# Patient Record
Sex: Female | Born: 1995 | Race: Black or African American | Hispanic: No | Marital: Single | Smoking: Never smoker
Health system: Southern US, Community
[De-identification: ages and names within clinical notes are randomized; demographics above are authoritative.]

## PROBLEM LIST (undated history)

## (undated) ENCOUNTER — Inpatient Hospital Stay (HOSPITAL_COMMUNITY): Payer: Self-pay

## (undated) DIAGNOSIS — Z87898 Personal history of other specified conditions: Secondary | ICD-10-CM

## (undated) DIAGNOSIS — R569 Unspecified convulsions: Secondary | ICD-10-CM

## (undated) DIAGNOSIS — N390 Urinary tract infection, site not specified: Secondary | ICD-10-CM

## (undated) DIAGNOSIS — Z8619 Personal history of other infectious and parasitic diseases: Secondary | ICD-10-CM

## (undated) DIAGNOSIS — IMO0002 Reserved for concepts with insufficient information to code with codable children: Secondary | ICD-10-CM

## (undated) HISTORY — DX: Unspecified convulsions: R56.9

## (undated) HISTORY — DX: Personal history of other infectious and parasitic diseases: Z86.19

## (undated) HISTORY — PX: TYMPANOSTOMY TUBE PLACEMENT: SHX32

## (undated) HISTORY — PX: EYE SURGERY: SHX253

## (undated) HISTORY — PX: WISDOM TOOTH EXTRACTION: SHX21

---
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
## 2015-09-12 ENCOUNTER — Encounter (HOSPITAL_COMMUNITY): Payer: Self-pay | Admitting: Emergency Medicine

## 2015-09-12 ENCOUNTER — Ambulatory Visit (HOSPITAL_COMMUNITY)
Admission: EM | Admit: 2015-09-12 | Discharge: 2015-09-12 | Disposition: A | Discharge status: Home / Self Care | Payer: Medicaid Other | Attending: Internal Medicine | Admitting: Internal Medicine

## 2015-09-12 DIAGNOSIS — Z202 Contact with and (suspected) exposure to infections with a predominantly sexual mode of transmission: Secondary | ICD-10-CM

## 2015-09-12 LAB — POCT URINALYSIS DIP (DEVICE)
GLUCOSE, UA: 100 mg/dL — AB
Ketones, ur: NEGATIVE mg/dL
NITRITE: NEGATIVE
PROTEIN: 30 mg/dL — AB
SPECIFIC GRAVITY, URINE: 1.025 (ref 1.005–1.030)
UROBILINOGEN UA: 1 mg/dL (ref 0.0–1.0)
pH: 6 (ref 5.0–8.0)

## 2015-09-12 LAB — POCT PREGNANCY, URINE: Preg Test, Ur: NEGATIVE

## 2015-09-12 MED ORDER — CEFTRIAXONE SODIUM 250 MG IJ SOLR: 250.0000 mg | Freq: Once | INTRAMUSCULAR | Status: DC

## 2015-09-12 MED ORDER — AZITHROMYCIN 250 MG PO TABS
1000.0000 mg | ORAL_TABLET | Freq: Once | ORAL | Status: AC
  Administered 2015-09-12: 1000 mg via ORAL

## 2015-09-12 MED ORDER — AZITHROMYCIN 250 MG PO TABS
ORAL_TABLET | ORAL | Status: AC
  Filled 2015-09-12: qty 4

## 2015-09-12 MED ORDER — CEFTRIAXONE SODIUM 250 MG IJ SOLR
INTRAMUSCULAR | Status: AC
  Filled 2015-09-12: qty 250

## 2015-09-12 MED ORDER — LIDOCAINE HCL (PF) 1 % IJ SOLN
INTRAMUSCULAR | Status: AC
  Filled 2015-09-12: qty 2

## 2015-09-12 NOTE — ED Triage Notes (Signed)
[facility] a [facility] a possible STD exposure and requested that she be tested for STDs. The patient reported no symptoms at this time.

## 2015-09-12 NOTE — ED Provider Notes (Signed)
[facility]    CSN: [id number] Arrival date & time: 09/12/15  4098  First Provider Contact:  First MD Initiated Contact with Patient 09/12/15 1020        History   Chief Complaint Chief Complaint  Patient presents with  . Exposure to STD    HPI Kristi Novak is a 20 y.o. female. She presents today with request to assess for STDs including HIV.  No dysuria, no urinary frequency.  No unusual vag discharge/bleeding.  No change in bowel habits.  No fever, no weight loss, no unusual achiness/joint pains.  Feels fine.  HPI  Past Medical History:  Diagnosis Date  . Hearing loss   . 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 Hypertropia RIGHT EYE    Patient Active Problem List   Diagnosis Date Noted  . Hypertropia 08/20/2011  . Normal delivery 12/22/2010  . Anemia in mother complicating pregnancy, childbirth or puerperium 12/22/2010    Past Surgical History:  Procedure Laterality Date  . EYE SURGERY  05-01-2005  [facility]   LEFT EYE  AND  1999 BILATERAL EYES 1999  . EYE SURGERY    . MEDIAN RECTUS REPAIR  08/21/2011   Procedure: MEDIAN RECTUS REPAIR;  Surgeon: Corinda Gubler, MD;  Location: [facility];  Service: Ophthalmology;  Laterality: Right;  inferior oblique myectomy  . TYMPANOSTOMY TUBE PLACEMENT  AGE 78   AND ADENOIDECTOMY  . WISDOM TOOTH EXTRACTION     ORAL SURGEON OFFICE    OB History    Gravida Para Term Preterm AB Living   1 1 1     1    SAB TAB Ectopic Multiple Live Births           1       Home Medications   Takes no meds regularly  Family History Family History  Problem Relation Age of Onset  . Cancer Maternal Grandmother     breast  . Hypertension Other     Social History Social History  Substance Use Topics  . Smoking status: Never Smoker  . Smokeless tobacco: Never Used  . Alcohol use No     Allergies   Review of patient's allergies indicates no known allergies.   Review of  Systems Review of Systems  All other systems reviewed and are negative.   Physical Exam Triage Vital Signs ED Triage Vitals  Enc Vitals Group     BP 09/12/15 1016 125/84     Pulse Rate 09/12/15 1016 86     Resp 09/12/15 1016 18     Temp 09/12/15 1016 98.4 F (36.9 C)     Temp Source 09/12/15 1016 Oral     SpO2 09/12/15 1016 100 %     Weight --      Height --      Pain Score 09/12/15 1017 0   Updated Vital Signs BP 125/84 (BP Location: Right Arm)   Pulse 86   Temp 98.4 F (36.9 C) (Oral)   Resp 18   LMP 09/10/2015 (Exact Date)   SpO2 100%  Physical Exam  Constitutional: She is oriented to person, place, and time. No distress.  Alert, nicely groomed  HENT:  Head: Atraumatic.  Eyes:  Conjugate gaze, no eye redness/drainage  Neck: Neck supple.  Cardiovascular: Normal rate.   Pulmonary/Chest: No respiratory distress.  Abdominal: She exhibits no distension.  Musculoskeletal: Normal range of motion.  No leg swelling  Neurological: She is alert and  oriented to person, place, and time.  Skin: Skin is warm and dry.  No cyanosis  Nursing note and vitals reviewed.    UC Treatments / Results  Labs Results for orders placed or performed during the hospital encounter of 09/12/15  POCT urinalysis dip (device)  Result Value Ref Range   Glucose, UA 100 (A) NEGATIVE mg/dL   Bilirubin Urine SMALL (A) NEGATIVE   Ketones, ur NEGATIVE NEGATIVE mg/dL   Specific Gravity, Urine 1.025 1.005 - 1.030   Hgb urine dipstick TRACE (A) NEGATIVE   pH 6.0 5.0 - 8.0   Protein, ur 30 (A) NEGATIVE mg/dL   Urobilinogen, UA 1.0 0.0 - 1.0 mg/dL   Nitrite NEGATIVE NEGATIVE   Leukocytes, UA SMALL (A) NEGATIVE  Pregnancy, urine POC  Result Value Ref Range   Preg Test, Ur NEGATIVE NEGATIVE     Procedures Procedures (including critical care time)  Medications Ordered in UC Medications  azithromycin (ZITHROMAX) tablet 1,000 mg (1,000 mg Oral Given 09/12/15 1124)   patient declined rocephin  injection Difficult lab draw, not able to obtain sample for HIV/rpr testing.  Patient will pursue this testing on another occasion.  Final Clinical Impressions(s) / UC Diagnoses   Final diagnoses:  Possible exposure to STD   If any of the tests are positive, the urgent care will contact you with the results and any instructions for followup treatment. Results are also available by signing up for the MyChart app.      Eustace MooreLaura W Deborah Lazcano, MD 09/17/15 [phone]

## 2015-09-12 NOTE — ED Notes (Signed)
Patient refused ceftriaxone injection. Dr. Dayton ScrapeMurray advised of same.

## 2015-09-12 NOTE — Discharge Instructions (Addendum)
If any of the tests are positive, the urgent care will contact you with the results and any instructions for followup treatment. Results are also available by signing up for the MyChart app.

## 2016-02-12 ENCOUNTER — Ambulatory Visit (HOSPITAL_COMMUNITY)
Admission: EM | Admit: 2016-02-12 | Discharge: 2016-02-12 | Disposition: A | Discharge status: Home / Self Care | Payer: Medicaid Other | Attending: Family | Admitting: Family

## 2016-02-12 ENCOUNTER — Encounter (HOSPITAL_COMMUNITY): Payer: Self-pay | Admitting: Emergency Medicine

## 2016-02-12 DIAGNOSIS — N898 Other specified noninflammatory disorders of vagina: Secondary | ICD-10-CM | POA: Diagnosis present

## 2016-02-12 DIAGNOSIS — R1030 Lower abdominal pain, unspecified: Secondary | ICD-10-CM | POA: Diagnosis not present

## 2016-02-12 DIAGNOSIS — Z113 Encounter for screening for infections with a predominantly sexual mode of transmission: Secondary | ICD-10-CM | POA: Diagnosis not present

## 2016-02-12 DIAGNOSIS — N760 Acute vaginitis: Secondary | ICD-10-CM | POA: Diagnosis not present

## 2016-02-12 DIAGNOSIS — Z8619 Personal history of other infectious and parasitic diseases: Secondary | ICD-10-CM | POA: Diagnosis not present

## 2016-02-12 LAB — POCT URINALYSIS DIP (DEVICE)
Bilirubin Urine: NEGATIVE
GLUCOSE, UA: NEGATIVE mg/dL
Ketones, ur: NEGATIVE mg/dL
Nitrite: NEGATIVE
PROTEIN: NEGATIVE mg/dL
SPECIFIC GRAVITY, URINE: 1.025 (ref 1.005–1.030)
UROBILINOGEN UA: 1 mg/dL (ref 0.0–1.0)
pH: 6.5 (ref 5.0–8.0)

## 2016-02-12 LAB — POCT PREGNANCY, URINE: Preg Test, Ur: NEGATIVE

## 2016-02-12 MED ORDER — AZITHROMYCIN 250 MG PO TABS
1000.0000 mg | ORAL_TABLET | Freq: Once | ORAL | Status: AC
  Administered 2016-02-12: 1000 mg via ORAL

## 2016-02-12 MED ORDER — AZITHROMYCIN 250 MG PO TABS
ORAL_TABLET | ORAL | Status: AC
  Filled 2016-02-12: qty 4

## 2016-02-12 MED ORDER — METRONIDAZOLE 500 MG PO TABS: 500.0000 mg | ORAL_TABLET | Freq: Two times a day (BID) | ORAL | 0 refills | Status: AC

## 2016-02-12 NOTE — Discharge Instructions (Signed)
You were given antibiotic (Zithromax) today at Urgent Care to treat a bacterial infection in your vagina. Also start Flagyl 500mg  twice a day for 7 days to treat your vaginal discharge. Do not have sex for at least 2 weeks. Next time you have sex you need to use condoms to protect yourself against infection. Follow-up pending lab results.

## 2016-02-12 NOTE — ED Provider Notes (Signed)
CSN: [id number]     Arrival date & time 02/12/16  [id number] History   First MD Initiated Contact with Patient 02/12/16 1022     Chief Complaint  Patient presents with  . SEXUALLY TRANSMITTED DISEASE   (Consider location/radiation/quality/duration/timing/severity/associated sxs/prior Treatment) 21 year old female presents for STD testing. She has been experiencing unusual vaginal discharge for about 2 weeks or so. Discharge is mostly white to clear and "stringy". She denies any fever, dysuria or GI symptoms. Last period is unknown. Has irregular periods and requests pregnancy test. Does not use condoms nor is on birth control. She is a poor historian and difficult to elicit accurate history.  She did test positive for Chlamydia and Trich in Sept 2017 (testing was done at PCP but she was given Zithromax here). She takes no daily medication.    The history is provided by the patient.    Past Medical History:  Diagnosis Date  . Hearing loss   . 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 Hypertropia RIGHT EYE   Past Surgical History:  Procedure Laterality Date  . E[facility] SURG[facility]  [facility]   LEFT EYE  AND  1999 BILATERAL EYES 1999  . EYE SURGERY    . MEDIAN RECTUS REPAIR  08/21/2011   Procedure: MEDIAN RECTUS REPAIR;  Surgeon: Corinda GublerMichael A Spencer, MD;  [facility] [place];  Service: Ophthalmology;  Laterality: Right;  inferior oblique myectomy  . TYMPANOSTOMY TUBE PLACEMENT  AGE 68   AND ADENOIDECTOMY  . WISDOM TOOTH EXTRACTION     ORAL SURGEON OFFICE   Family History  Problem Relation Age of Onset  . Cancer Maternal Grandmother     breast  . Hypertension Other    Social History  Substance Use Topics  . Smoking status: Never Smoker  . Smokeless tobacco: Never Used  . Alcohol use No   OB History    Gravida Para Term Preterm AB Living   1 1 1     1    SAB TAB Ectopic Multiple Live Births           1     Review of Systems  Constitutional: Negative  for appetite change, chills, fatigue and fever.  Respiratory: Negative for cough.   Cardiovascular: Negative for chest pain.  Gastrointestinal: Negative for abdominal pain, blood in stool, diarrhea, nausea and vomiting.  Genitourinary: Positive for vaginal discharge. Negative for dysuria, flank pain, genital sores, hematuria, pelvic pain, vaginal bleeding and vaginal pain.  Musculoskeletal: Negative for arthralgias, back pain and myalgias.  Skin: Negative for rash.  Neurological: Negative for dizziness, syncope and headaches.  Hematological: Negative for adenopathy.    Allergies  Patient has no known allergies.  Home Medications   Prior to Admission medications   Medication Sig Start Date End Date Taking? Authorizing Provider  metroNIDAZOLE (FLAGYL) 500 MG tablet Take 1 tablet (500 mg total) by mouth 2 (two) times daily. 02/12/16 02/19/16  Sudie GrumblingAnn Berry Markale Birdsell, NP   Meds Ordered and Administered this Visit   Medications  azithromycin (ZITHROMAX) tablet 1,000 mg (1,000 mg Oral Given 02/12/16 1105)    BP 131/89 (BP Location: Left Arm)   Pulse 77   Temp 97.6 F (36.4 C) (Oral)   Resp 16   SpO2 100%  No data found.   Physical Exam  Constitutional: She is oriented to person, place, and time. She appears well-developed and well-nourished. No distress.  HENT:  Mouth/Throat: Oropharynx is clear and moist.  Neck: Normal range of motion. Neck supple.  Cardiovascular: Normal rate, regular rhythm and normal heart sounds.   No murmur heard. Pulmonary/Chest: Effort normal and breath sounds normal. No respiratory distress. She has no wheezes. She has no rales.  Abdominal: Soft. Normal appearance and bowel sounds are normal. There is tenderness in the suprapubic area. There is no rigidity, no rebound, no guarding and no CVA tenderness. Hernia confirmed negative in the right inguinal area and confirmed negative in the left inguinal area.  Genitourinary: Pelvic exam was performed with patient in the  knee-chest position. There is no rash, tenderness, lesion or injury on the right labia. There is no rash, tenderness, lesion or injury on the left labia. Cervix exhibits discharge. Cervix exhibits no friability. Right adnexum displays no mass and no tenderness. Left adnexum displays no mass and no tenderness. No erythema, tenderness or bleeding in the vagina. Vaginal discharge (white with fishy odor) found.  Musculoskeletal: Normal range of motion.  Lymphadenopathy:    She has no cervical adenopathy.       Right: No inguinal adenopathy present.       Left: Inguinal adenopathy present.  Neurological: She is alert and oriented to person, place, and time.  Skin: Skin is warm and dry. Capillary refill takes less than 2 seconds.  Psychiatric: She has a normal mood and affect. Her behavior is normal.    Urgent Care Course     Procedures (including critical care time)  Labs Review Labs Reviewed  POCT URINALYSIS DIP (DEVICE) - Abnormal; Notable for the following:       Result Value   Hgb urine dipstick TRACE (*)    Leukocytes, UA TRACE (*)    All other components within normal limits  POCT PREGNANCY, URINE  CERVICOVAGINAL ANCILLARY ONLY    Imaging Review No results found.   Visual Acuity Review  Right Eye Distance:   Left Eye Distance:   Bilateral Distance:    Right Eye Near:   Left Eye Near:    Bilateral Near:         MDM   1. Acute vaginitis   2. Lower abdominal pain   3. Screening examination for STD (sexually transmitted disease)    Reviewed urinalysis results and negative pregnancy test with patient. Gave Zithromax 1G today at Urgent Care to treat possible Chlamydia. Patient refused Rocephin injection- will wait for lab results. Start Flagyl 500mg  twice a day for 7 days to treat possible BV/Trich. Discussed abstaining from sex for at least 2 weeks. Reminded patient that the next time she has sex, she needs to use condoms to protect herself against infection. Follow-up  pending lab results.       Sudie Grumbling, NP 02/12/16 [phone]

## 2016-02-12 NOTE — ED Triage Notes (Signed)
C/o std testing  

## 2016-02-13 LAB — CERVICOVAGINAL ANCILLARY ONLY
Chlamydia: NEGATIVE
Neisseria Gonorrhea: NEGATIVE
Wet Prep (BD Affirm): POSITIVE — AB

## 2016-06-02 ENCOUNTER — Encounter (HOSPITAL_COMMUNITY): Payer: Self-pay | Admitting: Emergency Medicine

## 2016-06-02 ENCOUNTER — Emergency Department (HOSPITAL_COMMUNITY)
Admission: EM | Admit: 2016-06-02 | Discharge: 2016-06-02 | Disposition: A | Discharge status: Home / Self Care | Payer: Medicaid Other | Attending: Emergency Medicine | Admitting: Emergency Medicine

## 2016-06-02 DIAGNOSIS — M545 Low back pain, unspecified: Secondary | ICD-10-CM

## 2016-06-02 MED ORDER — KETOROLAC TROMETHAMINE 60 MG/2ML IM SOLN
15.0000 mg | Freq: Once | INTRAMUSCULAR | Status: DC
  Filled 2016-06-02: qty 2

## 2016-06-02 MED ORDER — OXYCODONE HCL 5 MG PO TABS
5.0000 mg | ORAL_TABLET | Freq: Once | ORAL | Status: AC
  Administered 2016-06-02: 5 mg via ORAL
  Filled 2016-06-02: qty 1

## 2016-06-02 MED ORDER — DIAZEPAM 2 MG PO TABS
2.0000 mg | ORAL_TABLET | Freq: Once | ORAL | Status: AC
  Administered 2016-06-02: 2 mg via ORAL
  Filled 2016-06-02: qty 1

## 2016-06-02 MED ORDER — ACETAMINOPHEN 500 MG PO TABS
1000.0000 mg | ORAL_TABLET | Freq: Once | ORAL | Status: AC
  Administered 2016-06-02: 1000 mg via ORAL
  Filled 2016-06-02: qty 2

## 2016-06-02 NOTE — ED Triage Notes (Signed)
Pt c/o lower back pain x several weeks. Pt denies injury and states pain improves when lying down and worsens with bending over and activity. Pt states she has not taken any OTC meds or any at home treatment. Pt in no distress.

## 2016-06-02 NOTE — ED Provider Notes (Signed)
[facility]-EMERGENCY DEPT Provider Note   CSN: [id number] Arrival date & time: 06/02/16  [id number]     History   Chief Complaint Chief Complaint  Patient presents with  . Back Pain    HPI Kristi Novak is a 21 y.o. female.  21 yo F with a chief complaint of midline low back pain. Going on for the past 2 or 3 days. Denies trauma. Denies fevers or chills. Denies recent injection or surgery. Denies loss of bowel or bladder. Denies loss of perirectal sensation. Denies numbness or weakness. Denies radiation of the pain. Described as an ache. Worse with movement palpation twisting.   The history is provided by the patient.  Back Pain   This is a new problem. The current episode started 2 days ago. The problem occurs constantly. The problem has not changed since onset.The pain is present in the lumbar spine. The quality of the pain is described as aching. The pain does not radiate. The pain is at a severity of 6/10. The pain is moderate. The symptoms are aggravated by bending and twisting. Pertinent negatives include no chest pain, no fever, no headaches and no dysuria. She has tried nothing for the symptoms. The treatment provided no relief.    Past Medical History:  Diagnosis Date  . Hearing loss   . 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 Hypertropia RIGHT EYE    Patient Active Problem List   Diagnosis Date Noted  . Hypertropia 08/20/2011  . Normal delivery 12/22/2010  . Anemia in mother complicating pregnancy, childbirth or puerperium 12/22/2010    Past Surgical History:  Procedure Laterality Date  . [facility]  [facility]   LEFT EYE  AND  1999 BILATERAL EYES 1999  . EYE SURGERY    . MEDIAN RECTUS REPAIR  08/21/2011   Procedure: MEDIAN RECTUS REPAIR;  Surgeon: Corinda GublerMichael A Spencer, MD;  [facility] [place];  Service: Ophthalmology;  Laterality: Right;  inferior oblique myectomy  . TYMPANOSTOMY TUBE PLACEMENT  AGE 89   AND ADENOIDECTOMY  .  WISDOM TOOTH EXTRACTION     ORAL SURGEON OFFICE    OB History    Gravida Para Term Preterm AB Living   1 1 1     1    SAB TAB Ectopic Multiple Live Births           1       Home Medications    Prior to Admission medications   Not on File    Family History Family History  Problem Relation Age of Onset  . Cancer Maternal Grandmother        breast  . Hypertension Other     Social History Social History  Substance Use Topics  . Smoking status: Never Smoker  . Smokeless tobacco: Never Used  . Alcohol use No     Allergies   Patient has no known allergies.   Review of Systems Review of Systems  Constitutional: Negative for chills and fever.  HENT: Negative for congestion and rhinorrhea.   Eyes: Negative for redness and visual disturbance.  Respiratory: Negative for shortness of breath and wheezing.   Cardiovascular: Negative for chest pain and palpitations.  Gastrointestinal: Negative for nausea and vomiting.  Genitourinary: Negative for dysuria and urgency.  Musculoskeletal: Positive for back pain. Negative for arthralgias and myalgias.  Skin: Negative for pallor and wound.  Neurological: Negative for dizziness and headaches.     Physical Exam Updated Vital Signs  BP 119/84 (BP Location: Left Arm)   Pulse 79   Temp 98.2 F (36.8 C) (Oral)   Resp 16   SpO2 100%   Physical Exam  Constitutional: She is oriented to person, place, and time. She appears well-developed and well-nourished. No distress.  HENT:  Head: Normocephalic and atraumatic.  Eyes: EOM are normal. Pupils are equal, round, and reactive to light.  Neck: Normal range of motion. Neck supple.  Cardiovascular: Normal rate and regular rhythm.  Exam reveals no gallop and no friction rub.   No murmur heard. Pulmonary/Chest: Effort normal. She has no wheezes. She has no rales.  Abdominal: Soft. She exhibits no distension and no mass. There is no tenderness. There is no guarding.  Musculoskeletal:  She exhibits tenderness (pain with palpation about L2-4 along the midline). She exhibits no edema.  Neurological: She is alert and oriented to person, place, and time.  Skin: Skin is warm and dry. She is not diaphoretic.  Psychiatric: She has a normal mood and affect. Her behavior is normal.  Nursing note and vitals reviewed.    ED Treatments / Results  Labs (all labs ordered are listed, but only abnormal results are displayed) Labs Reviewed - No data to display  EKG  EKG Interpretation None       Radiology No results found.  Procedures Procedures (including critical care time)  Medications Ordered in ED Medications  acetaminophen (TYLENOL) tablet 1,000 mg (not administered)  ketorolac (TORADOL) injection 15 mg (not administered)  oxyCODONE (Oxy IR/ROXICODONE) immediate release tablet 5 mg (not administered)  diazepam (VALIUM) tablet 2 mg (not administered)     Initial Impression / Assessment and Plan / ED Course  I have reviewed the triage vital signs and the nursing notes.  Pertinent labs & imaging results that were available during my care of the patient were reviewed by me and considered in my medical decision making (see chart for details).     21 yo  F With a chief complaint of low back pain. This atraumatic. She has no red flags. Treat symptomatically. Discharge home.  10:57 AM:  I have discussed the diagnosis/risks/treatment options with the patient and believe the pt to be eligible for discharge home to follow-up with PCP. We also discussed returning to the ED immediately if new or worsening sx occur. We discussed the sx which are most concerning (e.g., sudden worsening pain, fever, inability to tolerate by mouth) that necessitate immediate return. Medications administered to the patient during their visit and any new prescriptions provided to the patient are listed below.  Medications given during this visit Medications  acetaminophen (TYLENOL) tablet 1,000  mg (not administered)  ketorolac (TORADOL) injection 15 mg (not administered)  oxyCODONE (Oxy IR/ROXICODONE) immediate release tablet 5 mg (not administered)  diazepam (VALIUM) tablet 2 mg (not administered)     The patient appears reasonably screen and/or stabilized for discharge and I doubt any other medical condition or other [facility] requiring further screening, evaluation, or treatment in the ED at this time prior to discharge.    Final Clinical Impressions(s) / ED Diagnoses   Final diagnoses:  Acute midline low back pain without sciatica    New Prescriptions New Prescriptions   No medications on file     Melene Plan, DO 06/02/16 1057

## 2016-06-02 NOTE — Discharge Instructions (Signed)
Take 4 over the counter ibuprofen tablets 3 times a day or 2 over-the-counter naproxen tablets twice a day for pain. Also take tylenol 1000mg(2 extra strength) four times a day.    

## 2016-06-28 ENCOUNTER — Encounter: Payer: Self-pay | Admitting: Neurology

## 2016-09-18 ENCOUNTER — Ambulatory Visit (HOSPITAL_COMMUNITY)
Admission: EM | Admit: 2016-09-18 | Discharge: 2016-09-18 | Disposition: A | Discharge status: Home / Self Care | Payer: Medicaid Other | Attending: Family Medicine | Admitting: Family Medicine

## 2016-09-18 ENCOUNTER — Encounter (HOSPITAL_COMMUNITY): Payer: Self-pay | Admitting: Nurse Practitioner

## 2016-09-18 DIAGNOSIS — L299 Pruritus, unspecified: Secondary | ICD-10-CM | POA: Diagnosis present

## 2016-09-18 DIAGNOSIS — H5021 Vertical strabismus, right eye: Secondary | ICD-10-CM | POA: Insufficient documentation

## 2016-09-18 DIAGNOSIS — N898 Other specified noninflammatory disorders of vagina: Secondary | ICD-10-CM

## 2016-09-18 DIAGNOSIS — L298 Other pruritus: Secondary | ICD-10-CM | POA: Diagnosis not present

## 2016-09-18 MED ORDER — FLUCONAZOLE 150 MG PO TABS: ORAL_TABLET | ORAL | 0 refills | Status: DC

## 2016-09-18 NOTE — ED Triage Notes (Signed)
Pt presents with c/o vaginal itching. The itching began two days ago. She reports watery, white discharge. She denies any fevers, nausea, vomiting, dysuria, hematuria, vaginal bleeding. She reports recent sexual intercourse without protection.

## 2016-09-18 NOTE — ED Provider Notes (Signed)
  [facility]   [id number] 09/18/16 Arrival Time: 1018  ASSESSMENT & PLAN:  1. Vaginal itching    Suspect candidiasis although urine cytology sent for STI screening. Will notify her upon receipt of results. She may f/u as needed.  Meds ordered this encounter  Medications  . fluconazole (DIFLUCAN) 150 MG tablet    Sig: Take one tablet by mouth as a single dose.    Dispense:  1 tablet    Refill:  0   Reviewed expectations re: course of current medical issues. Questions answered. Outlined signs and symptoms indicating need for more acute intervention. Patient verbalized understanding. After Visit Summary given.   SUBJECTIVE:  Kristi Novak is a 21 y.o. female who presents with complaint of vaginal discharge for 2 days. White and clumpy. External irritation described. Vaginal itching. Sexually active with one female partner. Occasional condom use. No other vaginal discharge noted before current symptoms started. No self treatment.  Patient's last menstrual period was 09/09/2016.  ROS: As per HPI.  OBJECTIVE:  Vitals:   09/18/16 1057  BP: 133/77  Pulse: 67  Resp: 16  Temp: 98.3 F (36.8 C)  TempSrc: Oral  SpO2: 100%     General appearance: alert, cooperative, appears stated age and no distress Back: no CVA tenderness Abdomen: soft, non-tender; bowel sounds normal; no masses or organomegaly; no guarding or rebound tenderness GU: declines Skin: warm and dry Psychological:  Alert and cooperative. Normal mood and affect.   Labs Reviewed  URINE CYTOLOGY ANCILLARY ONLY    No Known Allergies  Past Medical History:  Diagnosis Date  . Hearing loss   . 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 Hypertropia RIGHT EYE   Family History  Problem Relation Age of Onset  . Cancer Maternal Grandmother        breast  . Hypertension Other    Social History   Social History  . Marital status: Single    Spouse name: N/A  . Number of children:  N/A  . Years of education: N/A   Occupational History  . Not on file.   Social History Main Topics  . Smoking status: Never Smoker  . Smokeless tobacco: Never Used  . Alcohol use No  . Drug use: No  . Sexual activity: Yes    Birth control/ protection: None   Other Topics Concern  . Not on file   Social History Narrative  . No narrative on file          Mardella LaymanHagler, Nithila Sumners, MD 09/18/16 1120

## 2016-09-19 LAB — URINE CYTOLOGY ANCILLARY ONLY
CHLAMYDIA, DNA PROBE: POSITIVE — AB
Neisseria Gonorrhea: NEGATIVE
TRICH (WINDOWPATH): POSITIVE — AB

## 2016-09-20 ENCOUNTER — Telehealth (HOSPITAL_COMMUNITY): Payer: Self-pay | Admitting: Emergency Medicine

## 2016-09-20 LAB — URINE CYTOLOGY ANCILLARY ONLY
Bacterial vaginitis: POSITIVE — AB
CANDIDA VAGINITIS: NEGATIVE

## 2016-09-20 MED ORDER — AZITHROMYCIN 250 MG PO TABS: 1000.0000 mg | ORAL_TABLET | Freq: Once | ORAL | 0 refills | Status: AC

## 2016-09-20 MED ORDER — METRONIDAZOLE 500 MG PO TABS: 500.0000 mg | ORAL_TABLET | Freq: Two times a day (BID) | ORAL | 0 refills | Status: DC

## 2016-09-20 NOTE — ED Provider Notes (Signed)
[facility]    CSN: [id number] Arrival date & time: 09/18/16  1018     History   Chief Complaint Chief Complaint  Patient presents with  . Vaginal Itching    HPI Kristi Novak is a 21 y.o. female.   Patient called for results today      Past Medical History:  Diagnosis Date  . Hearing loss   . 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 Hypertropia RIGHT EYE    Patient Active Problem List   Diagnosis Date Noted  . Hypertropia 08/20/2011  . Normal delivery 12/22/2010  . Anemia in mother complicating pregnancy, childbirth or puerperium 12/22/2010    Past Surgical History:  Procedure Laterality Date  . EYE SURGERY  05-01-2005  [facility]   LEFT EYE  AND  1999 BILATERAL EYES 1999  . EYE SURGERY    . MEDIAN RECTUS REPAIR  08/21/2011   Procedure: MEDIAN RECTUS REPAIR;  Surgeon: Corinda Gubler, MD;  Location: [facility];  Service: Ophthalmology;  Laterality: Right;  inferior oblique myectomy  . TYMPANOSTOMY TUBE PLACEMENT  AGE 62   AND ADENOIDECTOMY  . WISDOM TOOTH EXTRACTION     ORAL SURGEON OFFICE    OB History    Gravida Para Term Preterm AB Living   SAB TAB Ectopic Multiple Live Births           1       Home Medications    Prior to Admission medications   Medication Sig Start Date End Date Taking? Authorizing Provider  fluconazole (DIFLUCAN) 150 MG tablet Take one tablet by mouth as a single dose. 09/18/16   Mardella Layman, MD    Family History Family History  Problem Relation Age of Onset  . Cancer Maternal Grandmother        breast  . Hypertension Other     Social History Social History  Substance Use Topics  . Smoking status: Never Smoker  . Smokeless tobacco: Never Used  . Alcohol use No     Allergies   Patient has no known allergies.   Review of Systems Review of Systems   Physical Exam Triage Vital Signs ED Triage Vitals  Enc Vitals Group     BP 09/18/16 1057 133/77     Pulse Rate 09/18/16 1057 67     Resp 09/18/16 1057 16     Temp 09/18/16 1057 98.3 F (36.8 C)     Temp Source 09/18/16 1057 Oral     SpO2 09/18/16 1057 100 %     Weight --      Height --      Head Circumference --      Peak Flow --      Pain Score 09/18/16 1101 0     Pain Loc --      Pain Edu? --      Excl. in GC? --    No data found.   Updated Vital Signs BP 133/77 (BP Location: Left Arm)   Pulse 67   Temp 98.3 F (36.8 C) (Oral)   Resp 16   LMP 09/09/2016   SpO2 100%   Visual Acuity Right Eye Distance:   Left Eye Distance:   Bilateral Distance:    Right Eye Near:   Left Eye Near:    Bilateral Near:     Physical Exam   UC Treatments / Results  Labs (all  labs ordered are listed, but only abnormal results are displayed) Labs Reviewed  URINE CYTOLOGY ANCILLARY ONLY - Abnormal; Notable for the following:       Result Value   Chlamydia **POSITIVE** (*)    Trichomonas **POSITIVE** (*)    All other components within normal limits    EKG  EKG Interpretation None       Radiology No results found.  Procedures Procedures (including critical care time)  Medications Ordered in UC Medications - No data to display   Initial Impression / Assessment and Plan / UC Course  I have reviewed the triage vital signs and the nursing notes.  Pertinent labs & imaging results that were available during my care of the patient were reviewed by me and considered in my medical decision making (see chart for details).     Final Clinical Impressions(s) / UC Diagnoses   Final diagnoses:  Vaginal itching    New Prescriptions Discharge Medication List as of 09/18/2016 11:15 AM    START taking these medications   Details  fluconazole (DIFLUCAN) 150 MG tablet Take one tablet by mouth as a single dose., Print       Azithromycin and metronidazole called in  Controlled Substance Prescriptions [place] Controlled Substance Registry consulted? Not Applicable     Elvina Sidle, MD 09/20/16 [phone]

## 2016-09-20 NOTE — Telephone Encounter (Signed)
Pt called needing lab results... Per Dr. Milus Glazier... Ok to call in Azithromycin 1,000 mg to take all at once, #4 no refills and Flagyl 500 mg BID x7 days no refills.  E-Rx to [facility] ([address]) LM on pt's VM  Faxed info to [address].

## 2016-10-02 ENCOUNTER — Encounter: Payer: Self-pay | Admitting: Neurology

## 2016-10-02 ENCOUNTER — Ambulatory Visit (INDEPENDENT_AMBULATORY_CARE_PROVIDER_SITE_OTHER): Payer: Medicaid Other | Admitting: Neurology

## 2016-10-02 VITALS — BP 118/80 | HR 82 | Ht 64.0 in | Wt 175.2 lb

## 2016-10-02 DIAGNOSIS — G510 Bell's palsy: Secondary | ICD-10-CM

## 2016-10-02 DIAGNOSIS — Z87898 Personal history of other specified conditions: Secondary | ICD-10-CM | POA: Diagnosis not present

## 2016-10-02 NOTE — Patient Instructions (Signed)
We will get MRI brain to evaluate your facial weakness

## 2016-10-02 NOTE — Progress Notes (Signed)
[facility] Clinic Note - Initial Visit   Date: 10/02/16  Kristi Novak MRN: [id number] DOB: [birth year]   Dear Dr. August Saucer:  Thank you for your kind referral of Kristi Novak for consultation of facial weakness. Although her history is well known to you, please allow [place] to reiterate it for the purpose of our medical record. The patient was accompanied to the clinic by self.    History of Present Illness: Kristi Novak is a 21 y.o. left-handed African American female with history of seizures (seizure free since 2007, off medications) presenting for evaluation of facial weakness.   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  She sought the opinion of plastic surgery because of her asymmetrical smile who has asked for neurological evaluation.    Patient denies any difficulty with drooling, using a straw, chewing, taste, facial numbness, double vision, or droopy eye.  She has some speech changes, but this has also been from childhood.    Out-side paper records, electronic medical record, and images have been reviewed where available and summarized as:  CT head wo contrast 05/31/2003:  No acute intracranial abnormality.   Past Medical History:  Diagnosis Date  . Hearing loss   . 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 Hypertropia RIGHT EYE    Past Surgical History:  Procedure Laterality Date  . EYE SURGERY  05-01-2005  [facility]   LEFT EYE  AND  1999 BILATERAL EYES 1999  . EYE SURGERY    . MEDIAN RECTUS REPAIR  08/21/2011   Procedure: MEDIAN RECTUS REPAIR;  Surgeon: Corinda Gubler, MD;  Location: [facility];  Service: Ophthalmology;  Laterality: Right;   inferior oblique myectomy  . TYMPANOSTOMY TUBE PLACEMENT  AGE 29   AND ADENOIDECTOMY  . WISDOM TOOTH EXTRACTION     ORAL SURGEON OFFICE     Medications:  Outpatient Encounter Prescriptions as of 10/02/2016  Medication Sig  . [DISCONTINUED] fluconazole (DIFLUCAN) 150 MG tablet Take one tablet by mouth as a single dose.  . [DISCONTINUED] metroNIDAZOLE (FLAGYL) 500 MG tablet Take 1 tablet (500 mg total) by mouth 2 (two) times daily.   No facility-administered encounter medications on file as of 10/02/2016.      Allergies: No Known Allergies  Family History: Family History  Problem Relation Age of Onset  . Cancer Maternal Grandmother        breast  . Hypertension Other     Social History: Social History  Substance Use Topics  . Smoking status: Never Smoker  . Smokeless tobacco: Never Used  . Alcohol use No   Social History   Social History Narrative  . No narrative on file    Review of Systems:  CONSTITUTIONAL: No fevers, chills, night sweats, or weight loss.   EYES: No visual changes or eye pain ENT: No hearing changes.  No history of nose bleeds.   RESPIRATORY: No cough, wheezing and shortness of breath.   CARDIOVASCULAR: Negative for chest pain, and palpitations.   GI: Negative for abdominal discomfort, blood in stools or black stools.  No recent change in bowel habits.   GU:  No history of incontinence.   MUSCLOSKELETAL: No history of  joint pain or swelling.  No myalgias.   SKIN: Negative for lesions, rash, and itching.   HEMATOLOGY/ONCOLOGY: Negative for prolonged bleeding, bruising easily, and swollen nodes.  No history of cancer.   ENDOCRINE: Negative for cold or heat intolerance, polydipsia or goiter.   PSYCH:  No depression or anxiety symptoms.   NEURO: As Above.   Vital Signs:  BP 118/80   Pulse 82   Ht  (1.626 m)   Wt 175 lb 4 oz (79.5 kg)   LMP 09/09/2016   SpO2 97%   BMI 30.08 kg/m  Pain Scale: 0 on a scale of 0-10   General Medical  Exam:   General:  Well appearing, comfortable.   Eyes/ENT: see cranial nerve examination.   Neck: No masses appreciated.  Full range of motion without tenderness.  No carotid bruits. Respiratory:  Clear to auscultation, good air entry bilaterally.   Cardiac:  Regular rate and rhythm, no murmur.   Extremities:  No deformities, edema, or skin discoloration.  Skin:  No rashes or lesions.  Neurological Exam: MENTAL STATUS including orientation to time, place, person is intact.  She does have some delayed recall and slowed processing. Speech is not dysarthric.  CRANIAL NERVES: II:  No visual field defects.  Unremarkable fundi.   III-IV-VI: Pupils equal round and reactive to light.  Normal conjugate, extra-ocular eye movements in all directions of gaze.  No nystagmus.  No ptosis.   V:  Normal facial sensation.  Jaw jerk is absent.   VII:  Frontalis, orbicularis oculi, orbicularis oris is 5/5.  There is mild weakness with bilateral buccinator testing and R zygomaticus. Left depressor anguli muscle is weak and contributes to asymmetrical smile on the left.  No pathologic facial reflexes.  VIII:  Reduced hearing to ringer tub on the left, intact on the right.   IX-X:  Normal palatal movement.   XI:  Normal shoulder shrug and head rotation.   XII:  Normal tongue strength and range of motion, no deviation or fasciculation.  MOTOR: Motor strength is 5/5 throughout.  No atrophy, fasciculations or abnormal movements.  No pronator drift.  Tone is normal.    MSRs:  Right                                                                 Left brachioradialis 2+  brachioradialis 2+  biceps 2+  biceps 2+  triceps 2+  triceps 2+  patellar 2+  patellar 2+  ankle jerk 2+  ankle jerk 2+  Hoffman no  Hoffman no  plantar response down  plantar response down   SENSORY:  Normal and symmetric perception of light touch, and pinprick, vibration.   COORDINATION/GAIT: Normal finger-to- nose-finger.  Gait narrow  based and stable. Tandem and stressed gait intact.    IMPRESSION: Kristi Novak is a pleasant 21 year-old female referred for evaluation of facial weakness. She had history of complicated pregnancy and was born 5 months premature with extended NICU stay and multiple complications including seizures, facial weakness, hearing loss, and hypertropia.  She has been seizure free since 2007 and off AED.  She has fixed deficit of asymmetrical smile on the left and is seeking plastic surgery opinion who has referred patient here.  I will request  records from Dr. 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   The duration of this appointment visit was 40 minutes of face-to-face time with the patient.  Greater than 50% of this time was spent in counseling, explanation of diagnosis, planning of further management, and coordination of care.   Thank you for allowing me to participate in patient's care.  If I can answer any additional questions, I would be pleased to do so.    Sincerely,    Donika K. Allena Katz, DO

## 2016-10-14 ENCOUNTER — Ambulatory Visit
Admission: RE | Admit: 2016-10-14 | Discharge: 2016-10-14 | Disposition: A | Discharge status: Home / Self Care | Payer: Medicaid Other | Source: Ambulatory Visit | Attending: Neurology | Admitting: Neurology

## 2016-10-14 ENCOUNTER — Telehealth: Payer: Self-pay | Admitting: *Deleted

## 2016-10-14 DIAGNOSIS — Z87898 Personal history of other specified conditions: Secondary | ICD-10-CM

## 2016-10-14 DIAGNOSIS — G510 Bell's palsy: Secondary | ICD-10-CM

## 2016-10-14 NOTE — Telephone Encounter (Signed)
I have called Dr. [name] office for more notes.

## 2016-10-14 NOTE — Telephone Encounter (Signed)
Patient called back and has been given the results.  Do you have the name of the plastic surgeon?

## 2016-10-14 NOTE — Telephone Encounter (Signed)
Left message for patient to call me back. 

## 2016-10-14 NOTE — Telephone Encounter (Signed)
Dr. Shon Hough, plastic surgeon.

## 2016-10-14 NOTE — Telephone Encounter (Signed)
-----   Message from [name], DO sent at 10/14/2016  8:11 AM EDT ----- Please inform patient that MRI brain looks great and does not show any evidence of brain injury causing her facial weakness.  Do we have any notes from plastic surgery?  Thanks.

## 2016-10-14 NOTE — Telephone Encounter (Signed)
-----   Message from Donika K Patel, DO sent at 10/14/2016  8:11 AM EDT ----- Please inform patient that MRI brain looks great and does not show any evidence of brain injury causing her facial weakness.  Do we have any notes from plastic surgery?  Thanks. 

## 2016-10-15 ENCOUNTER — Telehealth: Payer: Self-pay | Admitting: Neurology

## 2016-10-15 NOTE — Telephone Encounter (Signed)
Patient called to let you know that she went to see Dr. Shon Hough today. Thanks

## 2016-10-22 ENCOUNTER — Telehealth: Payer: Self-pay | Admitting: *Deleted

## 2016-10-22 NOTE — Telephone Encounter (Signed)
MRI faxed

## 2016-10-22 NOTE — Telephone Encounter (Signed)
-----   Message from [name], DO sent at 10/22/2016 12:48 PM EDT ----- Please fax MRI report to Dr. [name] office. Thanks.

## 2016-10-30 ENCOUNTER — Telehealth: Payer: Self-pay | Admitting: Neurology

## 2016-10-30 NOTE — Telephone Encounter (Signed)
New Message  Pt verbalized she had MRI and received her results but wanting to know what are the next steps if there are any.   Please f/u with pt

## 2016-10-30 NOTE — Telephone Encounter (Signed)
I have spoken to her plastic surgeon and faxed her MRI report to him.  She should follow-up with him.

## 2016-10-30 NOTE — Telephone Encounter (Signed)
Patient given instructions

## 2016-10-30 NOTE — Telephone Encounter (Signed)
Please advise 

## 2016-11-22 ENCOUNTER — Ambulatory Visit (HOSPITAL_COMMUNITY)
Admission: EM | Admit: 2016-11-22 | Discharge: 2016-11-22 | Disposition: A | Discharge status: Home / Self Care | Payer: Medicaid Other | Attending: Internal Medicine | Admitting: Internal Medicine

## 2016-11-22 ENCOUNTER — Encounter (HOSPITAL_COMMUNITY): Payer: Self-pay | Admitting: Family Medicine

## 2016-11-22 DIAGNOSIS — N898 Other specified noninflammatory disorders of vagina: Secondary | ICD-10-CM | POA: Diagnosis not present

## 2016-11-22 DIAGNOSIS — H9202 Otalgia, left ear: Secondary | ICD-10-CM | POA: Diagnosis not present

## 2016-11-22 DIAGNOSIS — Z8249 Family history of ischemic heart disease and other diseases of the circulatory system: Secondary | ICD-10-CM | POA: Insufficient documentation

## 2016-11-22 DIAGNOSIS — Z711 Person with feared health complaint in whom no diagnosis is made: Secondary | ICD-10-CM

## 2016-11-22 DIAGNOSIS — J039 Acute tonsillitis, unspecified: Secondary | ICD-10-CM | POA: Insufficient documentation

## 2016-11-22 DIAGNOSIS — Z79899 Other long term (current) drug therapy: Secondary | ICD-10-CM | POA: Insufficient documentation

## 2016-11-22 DIAGNOSIS — Z9889 Other specified postprocedural states: Secondary | ICD-10-CM | POA: Insufficient documentation

## 2016-11-22 DIAGNOSIS — Z803 Family history of malignant neoplasm of breast: Secondary | ICD-10-CM | POA: Diagnosis not present

## 2016-11-22 LAB — POCT PREGNANCY, URINE: Preg Test, Ur: NEGATIVE

## 2016-11-22 LAB — POCT RAPID STREP A: Streptococcus, Group A Screen (Direct): NEGATIVE

## 2016-11-22 MED ORDER — CEFTRIAXONE SODIUM 250 MG IJ SOLR
INTRAMUSCULAR | Status: AC
  Filled 2016-11-22: qty 250

## 2016-11-22 MED ORDER — AZITHROMYCIN 250 MG PO TABS
1000.0000 mg | ORAL_TABLET | Freq: Once | ORAL | Status: AC
  Administered 2016-11-22: 1000 mg via ORAL

## 2016-11-22 MED ORDER — METRONIDAZOLE 500 MG PO TABS: 500.0000 mg | ORAL_TABLET | Freq: Two times a day (BID) | ORAL | 0 refills | Status: AC

## 2016-11-22 MED ORDER — AZITHROMYCIN 250 MG PO TABS
ORAL_TABLET | ORAL | Status: AC
  Filled 2016-11-22: qty 4

## 2016-11-22 MED ORDER — AMOXICILLIN 400 MG/5ML PO SUSR: 500.0000 mg | Freq: Three times a day (TID) | ORAL | 0 refills | Status: AC

## 2016-11-22 MED ORDER — CEFTRIAXONE SODIUM 250 MG IJ SOLR: 250.0000 mg | Freq: Once | INTRAMUSCULAR | Status: DC

## 2016-11-22 NOTE — ED Triage Notes (Signed)
Pt here for sore throat and tonsillar swelling. Also reports itching and irritation in vaginal area.n Denies discharge.

## 2016-11-22 NOTE — ED Provider Notes (Signed)
[facility]    CSN: [id number] Arrival date & time: 11/22/16  1154     History   Chief Complaint Chief Complaint  Patient presents with  . Sore Throat    HPI Kristi Novak is a 21 y.o. female.   Kristi Novak presents with complaints of sore throat for the past 2-3 days. Her tonsils feel swollen, pain is worse with swallowing. She has been able to eat and drink without difficulty but it has been painful. Denies fevers, rash, gi/gu complaints, cough, runny nose. Mild left ear pain. Has not taken any medications for her symptoms. No known ill contacts.   She also complaints of vaginal itching for the past few days. Denies abdominal pain. She has not noticed any discharge. States feels similar to when she had an STD, 09/2016. She did complete treatment at that time. She has a new sexual partner, does not use condoms. Last menstural period approximately 1 month ago. Denies urinary symptoms.    ROS per HPI.       Past Medical History:  Diagnosis Date  . Hearing loss   . 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 Hypertropia RIGHT EYE    Patient Active Problem List   Diagnosis Date Noted  . Hypertropia 08/20/2011  . Normal delivery 12/22/2010  . Anemia in mother complicating pregnancy, childbirth or puerperium 12/22/2010    Past Surgical History:  Procedure Laterality Date  . EYE SURGERY  05-01-2005  [facility]   LEFT EYE  AND  1999 BILATERAL EYES 1999  . EYE SURGERY    . MEDIAN RECTUS REPAIR Right 08/21/2011   Performed by Corinda Gubler, MD at [facility]  . TYMPANOSTOMY TUBE PLACEMENT  AGE 72   AND ADENOIDECTOMY  . WISDOM TOOTH EXTRACTION     ORAL SURGEON OFFICE    OB History    Gravida Para Term Preterm AB Living   1 1 1     1    SAB TAB Ectopic Multiple Live Births           1       Home Medications    Prior to Admission medications   Medication Sig Start Date End Date Taking? Authorizing Provider  amoxicillin  (AMOXIL) 400 MG/5ML suspension Take 6.3 mLs (500 mg total) 3 (three) times daily for 10 days by mouth. 11/22/16 12/02/16  Georgetta Haber, NP  metroNIDAZOLE (FLAGYL) 500 MG tablet Take 1 tablet (500 mg total) 2 (two) times daily for 7 days by mouth. 11/22/16 11/29/16  Georgetta Haber, NP    Family History Family History  Problem Relation Age of Onset  . Cancer Maternal Grandmother        breast  . Hypertension Other   . Healthy Mother   . Healthy Father     Social History Social History   Tobacco Use  . Smoking status: Never Smoker  . Smokeless tobacco: Never Used  Substance Use Topics  . Alcohol use: No  . Drug use: No     Allergies   Patient has no known allergies.   Review of Systems Review of Systems   Physical Exam Triage Vital Signs ED Triage Vitals  Enc Vitals Group     BP 11/22/16 1248 123/85     Pulse Rate 11/22/16 1248 61     Resp 11/22/16 1248 18     Temp 11/22/16 1248 97.6 F (36.4 C)     Temp src --  SpO2 11/22/16 1248 100 %     Weight --      Height --      Head Circumference --      Peak Flow --      Pain Score 11/22/16 1244 8     Pain Loc --      Pain Edu? --      Excl. in GC? --    No data found.  Updated Vital Signs BP 123/85   Pulse 61   Temp 97.6 F (36.4 C)   Resp 18   LMP 10/29/2016   SpO2 100%   Visual Acuity Right Eye Distance:   Left Eye Distance:   Bilateral Distance:    Right Eye Near:   Left Eye Near:    Bilateral Near:     Physical Exam  Constitutional: She is oriented to person, place, and time. She appears well-developed and well-nourished. No distress.  HENT:  Head: Normocephalic and atraumatic.  Right Ear: Tympanic membrane, external ear and ear canal normal.  Left Ear: Tympanic membrane, external ear and ear canal normal.  Nose: Nose normal.  Mouth/Throat: Uvula is midline, oropharynx is clear and moist and mucous membranes are normal. No tonsillar abscesses. Tonsils are 2+ on the right. Tonsils  are 2+ on the left. Tonsillar exudate.  bilateral tonsils with edema, redness and exudate  Eyes: Conjunctivae and EOM are normal. Pupils are equal, round, and reactive to light.  Cardiovascular: Normal rate, regular rhythm and normal heart sounds.  Pulmonary/Chest: Effort normal and breath sounds normal.  Abdominal: There is no tenderness.  Lymphadenopathy:    She has cervical adenopathy.  Neurological: She is alert and oriented to person, place, and time.  Skin: Skin is warm and dry.  Vitals reviewed.    UC Treatments / Results  Labs (all labs ordered are listed, but only abnormal results are displayed) Labs Reviewed  CULTURE, GROUP A STREP [facility]  POCT RAPID STREP A  URINE CYTOLOGY ANCILLARY ONLY    EKG  EKG Interpretation None       Radiology No results found.  Procedures Procedures (including critical care time)  Medications Ordered in UC Medications  cefTRIAXone (ROCEPHIN) injection 250 mg (not administered)  azithromycin (ZITHROMAX) tablet 1,000 mg (not administered)     Initial Impression / Assessment and Plan / UC Course  I have reviewed the triage vital signs and the nursing notes.  Pertinent labs & imaging results that were available during my care of the patient were reviewed by me and considered in my medical decision making (see chart for details).     Negative strep today but based on physical exam findings opted to provide treatment for tonsillitis. Push fluids to ensure adequate hydration and keep secretions thin. STD treatment provided due to high risk and patient requests treatment, patient declined Rocephin from nursing staff, stating she will return if positive test results indicate need. Flagyl for probable BV. Will notify patient of any positive cytology results. If symptoms worsen or do not improve in the next week to return to be seen or to follow up with PCP. Patient verbalized understanding and agreeable to plan.    Final Clinical  Impressions(s) / UC Diagnoses   Final diagnoses:  Acute tonsillitis, unspecified etiology  Vaginal discharge  Concern about STD in female without diagnosis    ED Discharge Orders        Ordered    metroNIDAZOLE (FLAGYL) 500 MG tablet  2 times daily     11/22/16  1348    amoxicillin (AMOXIL) 400 MG/5ML suspension  3 times daily     11/22/16 1348       Controlled Substance Prescriptions [place] Controlled Substance Registry consulted? Not Applicable   Georgetta HaberBurky, Dejae Bernet B, NP 11/22/16 [id number]

## 2016-11-22 NOTE — Discharge Instructions (Signed)
We provided treatment for STD's today. We will await urine sample results and notify you of any positive findings. We will also provide treatment for your tonsils, take medication twice a day for days. Ibuprofen can help with swelling and pain. Flagyl will be for presumed bacterial vaginosis. If any changes to these medications are needed we will notify you. Please withhold from intercourse for the next 7 days. Please use condoms. If symptoms worsen or do not improve in the next week to return to be seen or to follow up with PCP.

## 2016-11-25 LAB — URINE CYTOLOGY ANCILLARY ONLY
Chlamydia: NEGATIVE
Neisseria Gonorrhea: NEGATIVE
TRICH (WINDOWPATH): NEGATIVE

## 2016-11-25 LAB — CULTURE, GROUP A STREP (THRC)

## 2016-11-27 LAB — URINE CYTOLOGY ANCILLARY ONLY
BACTERIAL VAGINITIS: NEGATIVE
CANDIDA VAGINITIS: NEGATIVE

## 2017-03-14 ENCOUNTER — Encounter (HOSPITAL_COMMUNITY): Payer: Self-pay | Admitting: Emergency Medicine

## 2017-03-14 ENCOUNTER — Ambulatory Visit (HOSPITAL_COMMUNITY)
Admission: EM | Admit: 2017-03-14 | Discharge: 2017-03-14 | Disposition: A | Discharge status: Home / Self Care | Payer: Medicaid Other | Attending: Internal Medicine | Admitting: Internal Medicine

## 2017-03-14 ENCOUNTER — Other Ambulatory Visit: Payer: Self-pay

## 2017-03-14 DIAGNOSIS — R300 Dysuria: Secondary | ICD-10-CM

## 2017-03-14 LAB — POCT URINALYSIS DIP (DEVICE)
Bilirubin Urine: NEGATIVE
GLUCOSE, UA: NEGATIVE mg/dL
Ketones, ur: NEGATIVE mg/dL
Nitrite: NEGATIVE
PH: 6 (ref 5.0–8.0)
PROTEIN: 100 mg/dL — AB
UROBILINOGEN UA: 0.2 mg/dL (ref 0.0–1.0)

## 2017-03-14 MED ORDER — FLUCONAZOLE 150 MG PO TABS: 150.0000 mg | ORAL_TABLET | Freq: Every day | ORAL | 0 refills | Status: DC

## 2017-03-14 MED ORDER — CEPHALEXIN 500 MG PO CAPS: 500.0000 mg | ORAL_CAPSULE | Freq: Two times a day (BID) | ORAL | 0 refills | Status: AC

## 2017-03-14 NOTE — ED Provider Notes (Signed)
[facility]    CSN: [id number] Arrival date & time: 03/14/17  1556     History   Chief Complaint Chief Complaint  Patient presents with  . Dysuria    HPI Kristi Novak is a 22 y.o. female.   22 year old female comes in for 3-day history of dysuria, urinary frequency after starting her cycle.  States there is irritation to the labia as well.  Denies vaginal discharge, vaginal itching/pain.  Denies abdominal pain, nausea, vomiting.  Denies fever, chills, night sweats.  She is sexually active with one female partner, occasional condom use.  No other birth control use.  States that she uses pads, is new to the current brand.  However, she states she always uses different brands and has not had a problem in the past.  She does shave in the location, has not shaved since onset of symptoms.  No other new exposures known.      Past Medical History:  Diagnosis Date  . Hearing loss   . 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 Hypertropia RIGHT EYE    Patient Active Problem List   Diagnosis Date Noted  . Hypertropia 08/20/2011  . Normal delivery 12/22/2010  . Anemia in mother complicating pregnancy, childbirth or puerperium 12/22/2010    Past Surgical History:  Procedure Laterality Date  . EYE SURGERY  05-01-2005  [facility]   LEFT EYE  AND  1999 BILATERAL EYES 1999  . EYE SURGERY    . MEDIAN RECTUS REPAIR  08/21/2011   Procedure: MEDIAN RECTUS REPAIR;  Surgeon: Corinda Gubler, MD;  Location: [facility];  Service: Ophthalmology;  Laterality: Right;  inferior oblique myectomy  . TYMPANOSTOMY TUBE PLACEMENT  AGE 85   AND ADENOIDECTOMY  . WISDOM TOOTH EXTRACTION     ORAL SURGEON OFFICE    OB History    Gravida Para Term Preterm AB Living   1 1 1     1    SAB TAB Ectopic Multiple Live Births           1       Home Medications    Prior to Admission medications   Medication Sig Start Date End Date Taking? Authorizing Provider    cephALEXin (KEFLEX) 500 MG capsule Take 1 capsule (500 mg total) by mouth 2 (two) times daily for 7 days. 03/14/17 03/21/17  Belinda Fisher, PA-C  fluconazole (DIFLUCAN) 150 MG tablet Take 1 tablet (150 mg total) by mouth daily. Take second dose 72 hours later if symptoms still persists. 03/14/17   Belinda Fisher, PA-C    Family History Family History  Problem Relation Age of Onset  . Cancer Maternal Grandmother        breast  . Hypertension Other   . Healthy Mother   . Healthy Father     Social History Social History   Tobacco Use  . Smoking status: Never Smoker  . Smokeless tobacco: Never Used  Substance Use Topics  . Alcohol use: No  . Drug use: No     Allergies   Patient has no known allergies.   Review of Systems Review of Systems  Reason unable to perform ROS: See HPI as above.     Physical Exam Triage Vital Signs ED Triage Vitals  Enc Vitals Group     BP 03/14/17 1701 112/79     Pulse Rate 03/14/17 1701 70     Resp 03/14/17 1701 16  Temp 03/14/17 1701 98.1 F (36.7 C)     Temp Source 03/14/17 1701 Oral     SpO2 03/14/17 1701 100 %     Weight --      Height --      Head Circumference --      Peak Flow --      Pain Score 03/14/17 1700 0     Pain Loc --      Pain Edu? --      Excl. in GC? --    No data found.  Updated Vital Signs BP 112/79 (BP Location: Left Arm)   Pulse 70   Temp 98.1 F (36.7 C) (Oral)   Resp 16   LMP 03/14/2017   SpO2 100%   Physical Exam  Constitutional: She is oriented to person, place, and time. She appears well-developed and well-nourished. No distress.  HENT:  Head: Normocephalic and atraumatic.  Eyes: Conjunctivae are normal. Pupils are equal, round, and reactive to light.  Cardiovascular: Normal rate, regular rhythm and normal heart sounds. Exam reveals no gallop and no friction rub.  No murmur heard. Pulmonary/Chest: Effort normal and breath sounds normal. She has no wheezes. She has no rales.  Abdominal: Soft. Bowel  sounds are normal. She exhibits no mass. There is no tenderness. There is no rebound, no guarding and no CVA tenderness.  Genitourinary: There is no rash, tenderness, lesion or injury on the right labia. There is no rash, tenderness, lesion or injury on the left labia.  Genitourinary Comments: No tenderness, rash of the labia.  Speculum exam deferred.  Neurological: She is alert and oriented to person, place, and time.  Skin: Skin is warm and dry.  Psychiatric: She has a normal mood and affect. Her behavior is normal. Judgment normal.    UC Treatments / Results  Labs (all labs ordered are listed, but only abnormal results are displayed) Labs Reviewed  POCT URINALYSIS DIP (DEVICE) - Abnormal; Notable for the following components:      Result Value   Hgb urine dipstick MODERATE (*)    Protein, ur 100 (*)    Leukocytes, UA SMALL (*)    All other components within normal limits  URINE CULTURE    EKG  EKG Interpretation None       Radiology No results found.  Procedures Procedures (including critical care time)  Medications Ordered in UC Medications - No data to display   Initial Impression / Assessment and Plan / UC Course  I have reviewed the triage vital signs and the nursing notes.  Pertinent labs & imaging results that were available during my care of the patient were reviewed by me and considered in my medical decision making (see chart for details).    Urine dipstick with small leuks, given symptoms, will cover for UTI with Keflex.  Patient not wearing tampon, will attribute moderate blood due to onset of cycle.  Urine culture sent.  Will cover for yeast  with Diflucan. Patient declined STD testing.  Return precautions given.  Patient expresses understanding and agrees to plan.  Final Clinical Impressions(s) / UC Diagnoses   Final diagnoses:  Dysuria    ED Discharge Orders        Ordered    cephALEXin (KEFLEX) 500 MG capsule  2 times daily     03/14/17 1749     fluconazole (DIFLUCAN) 150 MG tablet  Daily     03/14/17 1749        Belinda FisherYu, Bridgitte Felicetti V, PA-C 03/14/17  1755  

## 2017-03-14 NOTE — ED Triage Notes (Signed)
The patient presented to the [facility] with a complaint of dysuria and urinary frequency x 3 days. 

## 2017-03-14 NOTE — Discharge Instructions (Signed)
Your urine showed possibility of urinary tract infection. Urine culture sent. Start Keflex as directed. Diflucan to cover for yeast as keflex can cause worsening of yeast. Keep hydrated, your urine should be clear to pale yellow in color. If symptoms continue, consider changing the brand of the pads. Monitor for any worsening of symptoms, fever, worsening abdominal pain, nausea/vomiting, flank pain, follow up for reevaluation.

## 2017-04-04 ENCOUNTER — Ambulatory Visit (HOSPITAL_COMMUNITY)
Admission: EM | Admit: 2017-04-04 | Discharge: 2017-04-04 | Disposition: A | Discharge status: Home / Self Care | Payer: Medicaid Other | Attending: Emergency Medicine | Admitting: Emergency Medicine

## 2017-04-04 ENCOUNTER — Encounter (HOSPITAL_COMMUNITY): Payer: Self-pay | Admitting: Emergency Medicine

## 2017-04-04 DIAGNOSIS — Z3202 Encounter for pregnancy test, result negative: Secondary | ICD-10-CM

## 2017-04-04 DIAGNOSIS — G44209 Tension-type headache, unspecified, not intractable: Secondary | ICD-10-CM | POA: Diagnosis not present

## 2017-04-04 DIAGNOSIS — N898 Other specified noninflammatory disorders of vagina: Secondary | ICD-10-CM | POA: Diagnosis not present

## 2017-04-04 DIAGNOSIS — N3090 Cystitis, unspecified without hematuria: Secondary | ICD-10-CM | POA: Diagnosis not present

## 2017-04-04 DIAGNOSIS — E860 Dehydration: Secondary | ICD-10-CM

## 2017-04-04 LAB — POCT PREGNANCY, URINE: PREG TEST UR: NEGATIVE

## 2017-04-04 LAB — POCT URINALYSIS DIP (DEVICE)
BILIRUBIN URINE: NEGATIVE
Glucose, UA: NEGATIVE mg/dL
HGB URINE DIPSTICK: NEGATIVE
KETONES UR: NEGATIVE mg/dL
Leukocytes, UA: NEGATIVE
Nitrite: NEGATIVE
PH: 5.5 (ref 5.0–8.0)
PROTEIN: NEGATIVE mg/dL
Urobilinogen, UA: 0.2 mg/dL (ref 0.0–1.0)

## 2017-04-04 MED ORDER — NAPROXEN 500 MG PO TABS: 500.0000 mg | ORAL_TABLET | Freq: Two times a day (BID) | ORAL | 0 refills | Status: DC

## 2017-04-04 NOTE — ED Triage Notes (Signed)
Pt c/o HA x3 days, hasn't taken anything at home for pain. Pt also c/o vaginal discharge.

## 2017-04-04 NOTE — ED Provider Notes (Signed)
[facility]    CSN: [id number] Arrival date & time: 04/04/17  [id number]     History   Chief Complaint Chief Complaint  [id number] presents with  . Headache  . Vaginal Discharge    HPI Kristi Novak is a 22 y.o. female.   Pt is here due to she has an intermit headache for approx 3 days, cloudy urine, and no cycle this months. Denies any n/v/d. Pt is unsure if she is preg or not . Does not have protected sex. Denies any abd pain. Has not taken anything pta     Past Medical History:  Diagnosis Date  . Hearing loss   . 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 Hypertropia RIGHT EYE    Patient Active Problem List   Diagnosis Date Noted  . Hypertropia 08/20/2011  . Normal delivery 12/22/2010  . Anemia in mother complicating pregnancy, childbirth or puerperium 12/22/2010    Past Surgical History:  Procedure Laterality Date  . [facility]   LEFT EYE  AND  1999 BILATERAL EYES 1999  . EYE SURGERY    . MEDIAN RECTUS REPAIR  08/21/2011   Procedure: MEDIAN RECTUS REPAIR;  Surgeon: Corinda GublerMichael A Spencer, MD;  [facility];  Service: Ophthalmology;  Laterality: Right;  inferior oblique myectomy  . TYMPANOSTOMY TUBE PLACEMENT  AGE 16   AND ADENOIDECTOMY  . WISDOM TOOTH EXTRACTION     ORAL SURGEON OFFICE    OB History    Gravida  1   Para  1   Term  1   Preterm      AB      Living  1     SAB      TAB      Ectopic      Multiple      Live Births  1            Home Medications    Prior to Admission medications   Medication Sig Start Date End Date Taking? Authorizing Provider  fluconazole (DIFLUCAN) 150 MG tablet Take 1 tablet (150 mg total) by mouth daily. Take second dose 72 hours later if symptoms still persists. Patient not taking: Reported on 04/04/2017 03/14/17   Belinda FisherYu, Amy V, PA-C  naproxen (NAPROSYN) 500 MG tablet Take 1 tablet (500 mg total) by mouth 2 (two) times daily. 04/04/17   Coralyn MarkMitchell,  Eduard Penkala L, NP    Family History Family History  Problem Relation Age of Onset  . Cancer Maternal Grandmother        breast  . Hypertension Other   . Healthy Mother   . Healthy Father     Social History Social History   Tobacco Use  . Smoking status: Never Smoker  . Smokeless tobacco: Never Used  Substance Use Topics  . Alcohol use: No  . Drug use: No     Allergies   Patient has no known allergies.   Review of Systems Review of Systems  Constitutional: Negative.   Respiratory: Negative.   Cardiovascular: Negative.   Gastrointestinal: Negative.   Genitourinary: Positive for urgency.       Cloudy color urine   Neurological: Positive for headaches.     Physical Exam Triage Vital Signs ED Triage Vitals  Enc Vitals Group     BP 04/04/17 1026 117/80     Pulse Rate 04/04/17 1026 84     Resp 04/04/17 1026 16  Temp 04/04/17 1026 98.1 F (36.7 C)     Temp src --      SpO2 04/04/17 1026 100 %     Weight --      Height --      Head Circumference --      Peak Flow --      Pain Score 04/04/17 1027 5     Pain Loc --      Pain Edu? --      Excl. in GC? --    No data found.  Updated Vital Signs BP 117/80   Pulse 84   Temp 98.1 F (36.7 C)   Resp 16   LMP 03/14/2017   SpO2 100%   Visual Acuity     Physical Exam  Constitutional: She appears well-developed.  HENT:  Head: Normocephalic.  Eyes: Pupils are equal, round, and reactive to light.  Neck: Normal range of motion.  Cardiovascular: Normal rate and normal heart sounds.  Pulmonary/Chest: Effort normal.  Abdominal: Soft.  Neurological: She is alert. She has normal strength.  Generalized headache, no blurred vision, no facial droop, intermit ,      UC Treatments / Results  Labs (all labs ordered are listed, but only abnormal results are displayed) Labs Reviewed  POCT URINALYSIS DIP (DEVICE)  POCT PREGNANCY, URINE    EKG None Radiology No results found.  Procedures Procedures  (including critical care time)  Medications Ordered in UC Medications - No data to display   Initial Impression / Assessment and Plan / UC Course  I have reviewed the triage vital signs and the nursing notes.  Pertinent labs & imaging results that were available during my care of the patient were reviewed by me and considered in my medical decision making (see chart for details).    Your urine does not show any infection we will send this off for a culture and if any results return they will call you  Stay hydrated drink plenty of fluids May take mortrin or tylenol for headache  If symptoms become worse so to the ER  Final Clinical Impressions(s) / UC Diagnoses   Final diagnoses:  Acute non intractable tension-type headache  Cystitis  Dehydration    ED Discharge Orders        Ordered    naproxen (NAPROSYN) 500 MG tablet  2 times daily     04/04/17 1056       Controlled Substance Prescriptions [place] Controlled Substance Registry consulted? Not Applicable   Coralyn Mark, NP 04/04/17 1118

## 2017-04-04 NOTE — Discharge Instructions (Signed)
Your urine does not show any infection we will send this off for a culture and if any results return they will call you  Stay hydrated drink plenty of fluids May take mortrin or tylenol for headache  If symptoms become worse so to the ER .

## 2017-06-22 ENCOUNTER — Encounter (HOSPITAL_COMMUNITY): Payer: Self-pay | Admitting: Urgent Care

## 2017-06-22 ENCOUNTER — Ambulatory Visit (HOSPITAL_COMMUNITY)
Admission: EM | Admit: 2017-06-22 | Discharge: 2017-06-22 | Disposition: A | Discharge status: Home / Self Care | Payer: Medicaid Other | Attending: Urgent Care | Admitting: Urgent Care

## 2017-06-22 DIAGNOSIS — O26891 Other specified pregnancy related conditions, first trimester: Secondary | ICD-10-CM | POA: Insufficient documentation

## 2017-06-22 DIAGNOSIS — N898 Other specified noninflammatory disorders of vagina: Secondary | ICD-10-CM

## 2017-06-22 DIAGNOSIS — Z3A00 Weeks of gestation of pregnancy not specified: Secondary | ICD-10-CM | POA: Diagnosis not present

## 2017-06-22 DIAGNOSIS — Z7251 High risk heterosexual behavior: Secondary | ICD-10-CM

## 2017-06-22 DIAGNOSIS — Z3491 Encounter for supervision of normal pregnancy, unspecified, first trimester: Secondary | ICD-10-CM

## 2017-06-22 LAB — POCT URINALYSIS DIP (DEVICE)
Glucose, UA: NEGATIVE mg/dL
Ketones, ur: 40 mg/dL — AB
NITRITE: NEGATIVE
PH: 5.5 (ref 5.0–8.0)
Protein, ur: 30 mg/dL — AB
Specific Gravity, Urine: 1.025 (ref 1.005–1.030)
Urobilinogen, UA: 0.2 mg/dL (ref 0.0–1.0)

## 2017-06-22 LAB — POCT PREGNANCY, URINE: PREG TEST UR: POSITIVE — AB

## 2017-06-22 NOTE — Discharge Instructions (Addendum)
Hydrate well with at least 2 liters (1 gallon) of water daily. Call the [facility] tomorrow to set up an office visit for your prenatal care. Pick up and start taking a prenatal vitamin. If you develop fever, vaginal bleeding, pelvic pain then report to the MAU at the [facility].

## 2017-06-22 NOTE — ED Triage Notes (Addendum)
Pt here for white vaginal discharge. She denies any itching or burning, she denies urinary symptoms. She hasn't had a period this month. She is also having some intermittent sharp abd pain.

## 2017-06-22 NOTE — ED Provider Notes (Signed)
MRN: [id number] DOB: 13-Nov-1995  Subjective:   Kristi Novak is a 22 y.o. female presenting for several day history of white vaginal discharge. Also reports left sided abdominal pain that last ~1 minute. Had slight vaginal bleeding/spotting yesterday. LMP was first week of May 2019, was regular. Denies fever, n/v, genital rash, vaginal discharge, dysuria, hematuria, urinary frequency. Denies smoking cigarettes or drinking alcohol.   Denies any current medications.    No Known Allergies  Past Medical History:  Diagnosis Date  . Hearing loss   . 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 Hypertropia RIGHT EYE     Past Surgical History:  Procedure Laterality Date  . EYE SURG[facility]  [facility]   LEFT EYE  AND  1999 BILATERAL EYES 1999  . EYE SURGERY    . MEDIAN RECTUS REPAIR  08/21/2011   Procedure: MEDIAN RECTUS REPAIR;  Surgeon: Corinda GublerMichael A Spencer, MD;  [facility];  Service: Ophthalmology;  Laterality: Right;  inferior oblique myectomy  . TYMPANOSTOMY TUBE PLACEMENT  AGE 46   AND ADENOIDECTOMY  . WISDOM TOOTH EXTRACTION     ORAL SURGEON OFFICE    Objective:   Vitals: BP 121/75   Pulse 84   Temp 98.4 F (36.9 C)   Resp 18   LMP 05/08/2017   SpO2 100%   Physical Exam  Constitutional: She is oriented to person, place, and time. She appears well-developed and well-nourished.  HENT:  Mouth/Throat: Oropharynx is clear and moist.  Cardiovascular: Normal rate, regular rhythm and intact distal pulses. Exam reveals no gallop and no friction rub.  No murmur heard. Pulmonary/Chest: No respiratory distress. She has no wheezes. She has no rales.  Abdominal: Soft. Bowel sounds are normal. She exhibits no distension and no mass. There is no tenderness. There is no rebound and no guarding.  Musculoskeletal: She exhibits no edema.  Neurological: She is alert and oriented to person, place, and time.  Skin: Skin is warm and dry. No rash noted.  No erythema. No pallor.  Psychiatric: She has a normal mood and affect.    Results for orders placed or performed during the hospital encounter of 06/22/17 (from the past 24 hour(s))  POCT urinalysis dip (device)     Status: Abnormal   Collection Time: 06/22/17  2:29 PM  Result Value Ref Range   Glucose, UA NEGATIVE NEGATIVE mg/dL   Bilirubin Urine SMALL (A) NEGATIVE   Ketones, ur 40 (A) NEGATIVE mg/dL   Specific Gravity, Urine 1.025 1.005 - 1.030   Hgb urine dipstick TRACE (A) NEGATIVE   pH 5.5 5.0 - 8.0   Protein, ur 30 (A) NEGATIVE mg/dL   Urobilinogen, UA 0.2 0.0 - 1.0 mg/dL   Nitrite NEGATIVE NEGATIVE   Leukocytes, UA TRACE (A) NEGATIVE  Pregnancy, urine POC     Status: Abnormal   Collection Time: 06/22/17  2:36 PM  Result Value Ref Range   Preg Test, Ur POSITIVE (A) NEGATIVE    Assessment and Plan :   Vaginal discharge  Unprotected sex  First trimester pregnancy  Patient reports that she does not hydrate with water at all.  Counseled that there is trace leukocytes, ketones, hematuria and proteinuria which may reflect dehydration as opposed to cystitis.  Due to the high risk of using medications during pregnancy, will defer to urine culture.  STI labs pending.  Patient is to start prenatal vitamin and try to set up [facility] care [facility].  ER precautions reviewed.   Wallis Bamberg, PA-C 06/22/17 1525

## 2017-06-23 ENCOUNTER — Telehealth: Payer: Self-pay | Admitting: Family Medicine

## 2017-06-23 ENCOUNTER — Telehealth (HOSPITAL_COMMUNITY): Payer: Self-pay | Admitting: Emergency Medicine

## 2017-06-23 LAB — URINE CULTURE: Culture: >=100000 — AB

## 2017-06-23 LAB — URINE CYTOLOGY ANCILLARY ONLY
CHLAMYDIA, DNA PROBE: NEGATIVE
NEISSERIA GONORRHEA: NEGATIVE
TRICH (WINDOWPATH): NEGATIVE

## 2017-06-23 MED ORDER — NITROFURANTOIN MONOHYD MACRO 100 MG PO CAPS: 100.0000 mg | ORAL_CAPSULE | Freq: Two times a day (BID) | ORAL | 0 refills | Status: AC

## 2017-06-23 NOTE — Telephone Encounter (Signed)
[facility] [address] Pt uses. Pt has UTI per [facility]. Positive preg test. Estimate LMP 05/08/17. Can we call in meds before seeing her for NEW OB?

## 2017-06-23 NOTE — Telephone Encounter (Signed)
Attempted to call pt back with no answer.

## 2017-06-25 ENCOUNTER — Inpatient Hospital Stay (HOSPITAL_COMMUNITY): Payer: Medicaid Other

## 2017-06-25 ENCOUNTER — Telehealth (HOSPITAL_COMMUNITY): Payer: Self-pay

## 2017-06-25 ENCOUNTER — Encounter: Payer: Self-pay | Admitting: Obstetrics and Gynecology

## 2017-06-25 ENCOUNTER — Other Ambulatory Visit: Payer: Self-pay

## 2017-06-25 ENCOUNTER — Encounter (HOSPITAL_COMMUNITY): Payer: Self-pay | Admitting: *Deleted

## 2017-06-25 ENCOUNTER — Inpatient Hospital Stay (HOSPITAL_COMMUNITY)
Admission: AD | Admit: 2017-06-25 | Discharge: 2017-06-25 | Disposition: A | Discharge status: Home / Self Care | Payer: Medicaid Other | Source: Ambulatory Visit | Attending: Obstetrics & Gynecology | Admitting: Obstetrics & Gynecology

## 2017-06-25 DIAGNOSIS — R109 Unspecified abdominal pain: Secondary | ICD-10-CM | POA: Diagnosis not present

## 2017-06-25 DIAGNOSIS — B9689 Other specified bacterial agents as the cause of diseases classified elsewhere: Secondary | ICD-10-CM | POA: Diagnosis not present

## 2017-06-25 DIAGNOSIS — O3680X0 Pregnancy with inconclusive fetal viability, not applicable or unspecified: Secondary | ICD-10-CM

## 2017-06-25 DIAGNOSIS — N3000 Acute cystitis without hematuria: Secondary | ICD-10-CM | POA: Diagnosis not present

## 2017-06-25 DIAGNOSIS — N898 Other specified noninflammatory disorders of vagina: Secondary | ICD-10-CM

## 2017-06-25 DIAGNOSIS — O2331 Infections of other parts of urinary tract in pregnancy, first trimester: Secondary | ICD-10-CM | POA: Insufficient documentation

## 2017-06-25 DIAGNOSIS — N760 Acute vaginitis: Secondary | ICD-10-CM

## 2017-06-25 DIAGNOSIS — Z3A01 Less than 8 weeks gestation of pregnancy: Secondary | ICD-10-CM | POA: Diagnosis not present

## 2017-06-25 DIAGNOSIS — O26891 Other specified pregnancy related conditions, first trimester: Secondary | ICD-10-CM | POA: Diagnosis not present

## 2017-06-25 LAB — CBC
HCT: 39.1 % (ref 36.0–46.0)
Hemoglobin: 13 g/dL (ref 12.0–15.0)
MCH: 29 pg (ref 26.0–34.0)
MCHC: 33.2 g/dL (ref 30.0–36.0)
MCV: 87.1 fL (ref 78.0–100.0)
Platelets: 267 10*3/uL (ref 150–400)
RBC: 4.49 MIL/uL (ref 3.87–5.11)
RDW: 13.1 % (ref 11.5–15.5)
WBC: 7.1 10*3/uL (ref 4.0–10.5)

## 2017-06-25 LAB — URINE CYTOLOGY ANCILLARY ONLY: CANDIDA VAGINITIS: NEGATIVE

## 2017-06-25 LAB — HCG, QUANTITATIVE, PREGNANCY: hCG, Beta Chain, Quant, S: 2054 m[IU]/mL — ABNORMAL HIGH (ref <5)

## 2017-06-25 LAB — ABO/RH: ABO/RH(D): A POS

## 2017-06-25 MED ORDER — METRONIDAZOLE 500 MG PO TABS: 500.0000 mg | ORAL_TABLET | Freq: Two times a day (BID) | ORAL | 0 refills | Status: DC

## 2017-06-25 NOTE — MAU Note (Signed)
Urine in lab 

## 2017-06-25 NOTE — MAU Note (Signed)
Here for medications for BV.  Has started meds for UTI. Stomach has been hurting and has a d/c. Feels like her period is going to start

## 2017-06-25 NOTE — MAU Provider Note (Signed)
Chief Complaint: needs rx; Abdominal Pain; and Vaginal Discharge   First Provider Initiated Contact with Patient 06/25/17 1756     SUBJECTIVE HPI: Kristi Novak is a 22 y.o. G2P1001 at [redacted]w[redacted]d by LMP who presents to maternity admissions reporting vaginal discharge and abdominal pain. She was seen at urgent care yesterday for the same symptoms and reports she was told to come to MAU for prescription for BV. She reports vaginal discharge started 3-4 days ago, describes the discharge as white thin discharge with no odor, she reports one occurrence of vaginal bleeding yesterday when she wipes. She reports abdominal pain started today, describes abdominal pain as lower abdominal cramping that comes in "waves". She rates pain 3/10- has not taken any medication for pain. She denies recent intercourse and denies continued vaginal bleeding. She denies vaginal itching/burning, h/a, dizziness, n/v, or fever/chills.  Currently has UTI that she started taking medication for yesterday    Past Medical History:  Diagnosis Date  . Hearing loss   . 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 Hypertropia RIGHT EYE   Past Surgical History:  Procedure Laterality Date  . EYE SURGERY  05-01-2005  [facility]   LEFT EYE  AND  1999 BILATERAL EYES 1999  . EYE SURGERY    . MEDIAN RECTUS REPAIR  08/21/2011   Procedure: MEDIAN RECTUS REPAIR;  Surgeon: Corinda Gubler, MD;  Location: [facility];  Service: Ophthalmology;  Laterality: Right;  inferior oblique myectomy  . TYMPANOSTOMY TUBE PLACEMENT  AGE 28   AND ADENOIDECTOMY  . WISDOM TOOTH EXTRACTION     ORAL SURGEON OFFICE   Social History   Socioeconomic History  . Marital status: Single    Spouse name: Not on file  . Number of children: 1  . Years of education: Not on file  . Highest education level: Not on file  Occupational History  . Occupation: Consulting civil engineer  Social Needs  . Financial resource strain: Not on file  . Food insecurity:     Worry: Not on file    Inability: Not on file  . Transportation needs:    Medical: Not on file    Non-medical: Not on file  Tobacco Use  . Smoking status: Never Smoker  . Smokeless tobacco: Never Used  Substance and Sexual Activity  . Alcohol use: No  . Drug use: No  . Sexual activity: Yes    Birth control/protection: None  Lifestyle  . Physical activity:    Days per week: Not on file    Minutes per session: Not on file  . Stress: Not on file  Relationships  . Social connections:    Talks on phone: Not on file    Gets together: Not on file    Attends religious service: Not on file    Active member of club or organization: Not on file    Attends meetings of clubs or organizations: Not on file    Relationship status: Not on file  . Intimate partner violence:    Fear of current or ex partner: Not on file    Emotionally abused: Not on file    Physically abused: Not on file    Forced sexual activity: Not on file  Other Topics Concern  . Not on file  Social History Narrative   Lives with mother, grandmother and child.     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    No current facility-administered medications on file prior  to encounter.    Current Outpatient Medications on File Prior to Encounter  Medication Sig Dispense Refill  . nitrofurantoin, macrocrystal-monohydrate, (MACROBID) 100 MG capsule Take 1 capsule (100 mg total) by mouth 2 (two) times daily for 10 days. 10 capsule 0   No Known Allergies  ROS:  Review of Systems  Respiratory: Negative.   Cardiovascular: Negative.   Gastrointestinal: Positive for abdominal pain. Negative for constipation, diarrhea, nausea and vomiting.  Genitourinary: Positive for vaginal bleeding and vaginal discharge. Negative for difficulty urinating, dysuria, frequency, pelvic pain and urgency.   I have reviewed patient's Past Medical Hx, Surgical Hx, Family Hx, Social Hx, medications and allergies.   Physical  Exam   Patient Vitals for the past 24 hrs:  BP Temp Temp src Pulse Resp SpO2 Weight  06/25/17 1625 123/65 98.5 F (36.9 C) Oral 74 16 100 % 186 lb 4 oz (84.5 kg)   Constitutional: Well-developed, well-nourished female in no acute distress.  Cardiovascular: normal rate Respiratory: normal effort GI: Abd soft, non-tender. Pos BS x 4 MS: Extremities nontender, no edema, normal ROM Neurologic: Alert and oriented x 4.  PELVIC EXAM: deferred, recent pelvic examination at urgent care yesterday   LAB RESULTS Results for orders placed or performed during the hospital encounter of 06/25/17 (from the past 24 hour(s))  CBC     Status: None   Collection Time: 06/25/17  6:17 PM  Result Value Ref Range   WBC 7.1 4.0 - 10.5 K/uL   RBC 4.49 3.87 - 5.11 MIL/uL   Hemoglobin 13.0 12.0 - 15.0 g/dL   HCT 78.2 95.6 - 21.3 %   MCV 87.1 78.0 - 100.0 fL   MCH 29.0 26.0 - 34.0 pg   MCHC 33.2 30.0 - 36.0 g/dL   RDW 08.6 57.8 - 46.9 %   Platelets 267 150 - 400 K/uL  hCG, quantitative, pregnancy     Status: Abnormal   Collection Time: 06/25/17  6:17 PM  Result Value Ref Range   hCG, Beta Chain, Quant, S 2,054 (H) <5 mIU/mL   ABO/Rh- A Pos     IMAGING US Ob Less Than 14 Weeks With Ob Transvaginal  Result Date: 06/25/2017 CLINICAL DATA:  Abdominal pain for several days. Gestational age by LMP of 7 weeks 0 days. EXAM: OBSTETRIC <14 WK [place] AND TRANSVAGINAL OB US TECHNIQUE: Both transabdominal and transvaginal ultrasound examinations were performed for complete evaluation of the gestation as well as the maternal uterus, adnexal regions, and pelvic cul-de-sac. Transvaginal technique was performed to assess early pregnancy. COMPARISON:  None. FINDINGS: Intrauterine gestational sac: Probable single early IUGS with double decidual sac sign Yolk sac:  Not Visualized. Embryo:  Not Visualized. MSD: 3 mm   4 w   6 d Subchorionic hemorrhage:  None visualized. Maternal uterus/adnexae: 2.6 cm right ovarian corpus luteum  cyst. Normal appearance of left ovary. No suspicious adnexal mass or abnormal free fluid identified. IMPRESSION: Probable single early approximately 5 week intrauterine gestational sac. Consider following serial b-hCG levels, with followup ultrasound to assess viability in 10-14 days. No suspicious adnexal mass or abnormal free fluid identified. Electronically Signed   By: Myles Rosenthal M.D.   On: 06/25/2017 20:01    MAU Management/MDM: Orders Placed This Encounter  Procedures  . US OB LESS THAN 14 WEEKS WITH OB TRANSVAGINAL  . CBC  . hCG, quantitative, pregnancy  . ABO/Rh  . Discharge patient Discharge disposition: 01-Home or Self Care; Discharge patient date: 06/25/2017   CBC- WNL  ABO/Rh-  A Pos   Meds ordered this encounter  Medications  . metroNIDAZOLE (FLAGYL) 500 MG tablet    Sig: Take 1 tablet (500 mg total) by mouth 2 (two) times daily.    Dispense:  14 tablet    Refill:  0    Order Specific Question:   Supervising Provider    Answer:   Jaynie CollinsANYANWU, UGONNA A [[id number]]   Discussed and reviewed US with patient. Discussed plan of care with patient and to follow up in 48 hours for repeat lab work due to no yolk sac being present on US at this time. Discussed reasons to return to MAU prior to lab work. Patient verbalizes understanding. Rx for Flagyl sent to pharmacy of choice for BV that was diagnosed at urgent care but not treated. Pt discharged with strict bleeding precautions. Pt stable at time of discharge.  ASSESSMENT 1. Pregnancy of unknown anatomic location   2. Abdominal pain during pregnancy in first trimester   3. Vaginal discharge during pregnancy in first trimester   4. BV (bacterial vaginosis)   5. Acute cystitis without hematuria     PLAN Discharge home Rx for Flagyl  Return to MAU on Saturday morning for repeat labwork  Return to MAU as needed for reasons discussed   Follow-up Information    [facility]. Go on 06/28/2017.    Why:  Return to MAU on Saturday 6/22 for repeat lab work  Contact information: [address] [id number] [place] [id number] [phone]          Allergies as of 06/25/2017   No Known Allergies     Medication List    TAKE these medications   metroNIDAZOLE 500 MG tablet Commonly known as:  FLAGYL Take 1 tablet (500 mg total) by mouth 2 (two) times daily.   nitrofurantoin (macrocrystal-monohydrate) 100 MG capsule Commonly known as:  MACROBID Take 1 capsule (100 mg total) by mouth 2 (two) times daily for 10 days.      Steward DroneVeronica Esmeralda Malay  Certified Nurse-Midwife 06/26/2017  10:21 PM

## 2017-06-25 NOTE — Telephone Encounter (Signed)
Bacterial vaginosis is positive. This was not treated at the urgent care visit.  Patient denies any symptoms. Pt is pregnant. Encouraged patient to call and talk with her OBGYN about positive results. Pt verbalized understanding.

## 2017-06-28 ENCOUNTER — Inpatient Hospital Stay (HOSPITAL_COMMUNITY)
Admission: AD | Admit: 2017-06-28 | Discharge: 2017-06-28 | Disposition: A | Discharge status: Home / Self Care | Payer: Medicaid Other | Source: Ambulatory Visit | Attending: Obstetrics & Gynecology | Admitting: Obstetrics & Gynecology

## 2017-06-28 ENCOUNTER — Other Ambulatory Visit: Payer: Self-pay | Admitting: Advanced Practice Midwife

## 2017-06-28 DIAGNOSIS — Z3A01 Less than 8 weeks gestation of pregnancy: Secondary | ICD-10-CM | POA: Diagnosis not present

## 2017-06-28 DIAGNOSIS — R109 Unspecified abdominal pain: Secondary | ICD-10-CM | POA: Diagnosis not present

## 2017-06-28 DIAGNOSIS — O26891 Other specified pregnancy related conditions, first trimester: Secondary | ICD-10-CM | POA: Insufficient documentation

## 2017-06-28 DIAGNOSIS — O3680X0 Pregnancy with inconclusive fetal viability, not applicable or unspecified: Secondary | ICD-10-CM

## 2017-06-28 LAB — HCG, QUANTITATIVE, PREGNANCY: hCG, Beta Chain, Quant, S: 6545 m[IU]/mL — ABNORMAL HIGH (ref <5)

## 2017-06-28 NOTE — Progress Notes (Addendum)
History   Chief Complaint:  Follow-up   Kristi Novak is  22 y.o. G2P1001 Patient's last menstrual period was 05/07/2017.Marland Kitchen. Patient is here for follow up of quantitative HCG and ongoing surveillance of pregnancy status.   She is 3163w3d weeks gestation  By US  Since her last visit, the patient is without new complaint.   The patient denies bleeding General ROS:  negative  Her previous Quantitative HCG values are: 2,054     Physical Exam   Blood pressure 128/72, pulse 84, temperature 97.8 F (36.6 C), temperature source Oral, resp. rate 16, weight 185 lb 1.3 oz (84 kg), last menstrual period 05/07/2017, SpO2 100 %.   Labs: Results for orders placed or performed during the hospital encounter of 06/28/17 (from the past 24 hour(s))  hCG, quantitative, pregnancy   Collection Time: 06/28/17  9:16 AM  Result Value Ref Range   hCG, Beta Chain, Quant, S 6,545 (H) <5 mIU/mL    Ultrasound Studies:   [place] Ob Less Than 14 Weeks With Ob Transvaginal  Result Date: 06/25/2017 CLINICAL DATA:  Abdominal pain for several days. Gestational age by LMP of 7 weeks 0 days. EXAM: OBSTETRIC <14 WK US AND TRANSVAGINAL OB US TECHNIQUE: Both transabdominal and transvaginal ultrasound examinations were performed for complete evaluation of the gestation as well as the maternal uterus, adnexal regions, and pelvic cul-de-sac. Transvaginal technique was performed to assess early pregnancy. COMPARISON:  None. FINDINGS: Intrauterine gestational sac: Probable single early IUGS with double decidual sac sign Yolk sac:  Not Visualized. Embryo:  Not Visualized. MSD: 3 mm   4 w   6 d Subchorionic hemorrhage:  None visualized. Maternal uterus/adnexae: 2.6 cm right ovarian corpus luteum cyst. Normal appearance of left ovary. No suspicious adnexal mass or abnormal free fluid identified. IMPRESSION: Probable single early approximately 5 week intrauterine gestational sac. Consider following serial b-hCG levels, with followup  ultrasound to assess viability in 10-14 days. No suspicious adnexal mass or abnormal free fluid identified. Electronically Signed   By: Myles RosenthalJohn  Stahl M.D.   On: 06/25/2017 20:01    Assessment:  7663w3d weeks gestation     Plan:   Patient [facility].  Clayton BiblesSamantha Kejuan Bekker, CNM 06/28/17  10:43 AM

## 2017-06-28 NOTE — MAU Note (Signed)
Kristi Novak is a 22 y.o. at 273w3d here in MAU for follow up blood work HCG Denies pain or bleeding. Vitals:   06/28/17 0854  BP: 128/72  Pulse: 84  Resp: 16  Temp: 97.8 F (36.6 C)  SpO2: 100%     Lab orders placed from triage: HCG per previous note

## 2017-07-24 ENCOUNTER — Ambulatory Visit (INDEPENDENT_AMBULATORY_CARE_PROVIDER_SITE_OTHER): Payer: Medicaid Other | Admitting: Obstetrics and Gynecology

## 2017-07-24 ENCOUNTER — Encounter: Payer: Self-pay | Admitting: Obstetrics and Gynecology

## 2017-07-24 ENCOUNTER — Other Ambulatory Visit (HOSPITAL_COMMUNITY)
Admission: RE | Admit: 2017-07-24 | Discharge: 2017-07-24 | Disposition: A | Discharge status: Home / Self Care | Payer: Medicaid Other | Source: Ambulatory Visit | Attending: Obstetrics and Gynecology | Admitting: Obstetrics and Gynecology

## 2017-07-24 VITALS — BP 105/73 | HR 83 | Wt 191.9 lb

## 2017-07-24 DIAGNOSIS — K219 Gastro-esophageal reflux disease without esophagitis: Secondary | ICD-10-CM

## 2017-07-24 DIAGNOSIS — Z3490 Encounter for supervision of normal pregnancy, unspecified, unspecified trimester: Secondary | ICD-10-CM

## 2017-07-24 DIAGNOSIS — Z8619 Personal history of other infectious and parasitic diseases: Secondary | ICD-10-CM | POA: Insufficient documentation

## 2017-07-24 DIAGNOSIS — Z3491 Encounter for supervision of normal pregnancy, unspecified, first trimester: Secondary | ICD-10-CM | POA: Diagnosis not present

## 2017-07-24 DIAGNOSIS — Z3482 Encounter for supervision of other normal pregnancy, second trimester: Secondary | ICD-10-CM | POA: Diagnosis not present

## 2017-07-24 MED ORDER — VITAFOL GUMMIES 3.33-0.333-34.8 MG PO CHEW: 1.0000 | CHEWABLE_TABLET | Freq: Every day | ORAL | 5 refills | Status: DC

## 2017-07-24 NOTE — Progress Notes (Signed)
INITIAL PRENATAL VISIT NOTE  Subjective:  Kristi Novak is a 22 y.o. G2P1001 at 2739w5d by US today being seen today for her initial prenatal visit. This is an unplanned pregnancy. She and partner are happy with the pregnancy. She was using nothing for birth control previously. She has an obstetric history significant for 1 x SVD. She has a medical history significant for chlamydia and trichomonas diagnosed several months ago, both treated.  Patient reports occasional cramping, acid taste in mouth but no actual vomiting..  Contractions: Irritability. Vag. Bleeding: None.   . Denies leaking of fluid.    Past Medical History:  Diagnosis Date  . H/O chlamydia infection   . H/O trichomoniasis   . Hearing loss   . 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 Hypertropia RIGHT EYE    Past Surgical History:  Procedure Laterality Date  . EYE SURGERY  05-01-2005  [facility]   LEFT EYE  AND  1999 BILATERAL EYES 1999  . EYE SURGERY    . MEDIAN RECTUS REPAIR  08/21/2011   Procedure: MEDIAN RECTUS REPAIR;  Surgeon: Corinda GublerMichael A Spencer, MD;  [facility];  Service: Ophthalmology;  Laterality: Right;  inferior oblique myectomy  . TYMPANOSTOMY TUBE PLACEMENT  AGE 10   AND ADENOIDECTOMY  . WISDOM TOOTH EXTRACTION     ORAL SURGEON OFFICE    OB History  Gravida Para Term Preterm AB Living  2 1 1     1   SAB TAB Ectopic Multiple Live Births          1    # Outcome Date GA Lbr Len/2nd Weight Sex Delivery Anes PTL Lv  2 Current           1 Term 12/20/10 4874w2d 139:45 / 00:45 6 lb 8.8 oz (2.971 kg) M Vag-Spont EPI  LIV    Social History   Socioeconomic History  . Marital status: Single    Spouse name: Not on file  . Number of children: 1  . Years of education: Not on file  . Highest education level: Not on file  Occupational History  . Occupation: Consulting civil engineerstudent  Social Needs  . Financial resource strain: Not on file  . Food insecurity:    Worry: Not on file   Inability: Not on file  . Transportation needs:    Medical: Not on file    Non-medical: Not on file  Tobacco Use  . Smoking status: Never Smoker  . Smokeless tobacco: Never Used  Substance and Sexual Activity  . Alcohol use: No  . Drug use: No  . Sexual activity: Yes    Partners: Male    Birth control/protection: None  Lifestyle  . Physical activity:    Days per week: Not on file    Minutes per session: Not on file  . Stress: Not on file  Relationships  . Social connections:    Talks on phone: Not on file    Gets together: Not on file    Attends religious service: Not on file    Active member of club or organization: Not on file    Attends meetings of clubs or organizations: Not on file    Relationship status: Not on file  Other Topics Concern  . Not on file  Social History Narrative   Lives with mother, grandmother and child.     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     Family History  Problem  Relation Age of Onset  . Cancer Maternal Grandmother        breast  . Hypertension Other   . Healthy Mother   . Healthy Father      (Not in a hospital admission)  No Known Allergies  Review of Systems: Negative except for what is mentioned in HPI.  Objective:   Vitals:   07/24/17 1338  BP: 105/73  Pulse: 83  Weight: 191 lb 14.4 oz (87 kg)    Fetal Status:         FHR 165 bpm seen on [place] today  Physical Exam: BP 105/73   Pulse 83   Wt 191 lb 14.4 oz (87 kg)   LMP 05/07/2017   BMI 32.94 kg/m  CONSTITUTIONAL: Well-developed, well-nourished female in no acute distress.  NEUROLOGIC: Alert and oriented to person, place, and time. Normal reflexes, muscle tone coordination. No cranial nerve deficit noted. PSYCHIATRIC: Normal mood and affect. Normal behavior. Normal judgment and thought content. Limited understanding of basic english. SKIN: Skin is warm and dry. No rash noted. Not diaphoretic. No erythema. No pallor. HENT:  Normocephalic,  atraumatic, External right and left ear normal. Oropharynx is clear and moist EYES: Conjunctivae and EOM are normal. Pupils are equal, round, and reactive to light. No scleral icterus.  NECK: Normal range of motion, supple, no masses CARDIOVASCULAR: Normal heart rate noted, regular rhythm RESPIRATORY: Effort and breath sounds normal, no problems with respiration noted BREASTS: symmetric, non-tender, no masses palpable ABDOMEN: Soft, nontender, nondistended, gravid. GU: normal appearing external female genitalia, nulliparous normal appearing cervix, scant white discharge in vagina, no lesions noted Bimanual: 10 weeks sized uterus, no adnexal tenderness or palpable lesions noted MUSCULOSKELETAL: Normal range of motion. EXT:  No edema and no tenderness. 2+ distal pulses.   Assessment and Plan:  Pregnancy: G2P1001 at [redacted]w[redacted]d by [place] today  1. Encounter for supervision of normal pregnancy, antepartum, unspecified gravidity - Cytology - PAP - Cervicovaginal ancillary only - Culture, OB Urine - Cystic Fibrosis Mutation 97 - Hemoglobinopathy evaluation - Obstetric Panel, Including HIV Referral to SW for resources  2. Gastroesophageal reflux disease without esophagitis To take tums  3. H/O chlamydia infection Swabs sent today  4. H/O trichomoniasis Swab sent today    Preterm labor symptoms and general obstetric precautions including but not limited to vaginal bleeding, contractions, leaking of fluid and fetal movement were reviewed in detail with the patient.  Please refer to After Visit Summary for other counseling recommendations.   Return in about 1 month (around 08/21/2017).  Conan Bowens 07/24/2017 5:07 PM

## 2017-07-25 LAB — CERVICOVAGINAL ANCILLARY ONLY
Chlamydia: NEGATIVE
NEISSERIA GONORRHEA: NEGATIVE

## 2017-07-25 LAB — CYTOLOGY - PAP: DIAGNOSIS: NEGATIVE

## 2017-07-27 LAB — CULTURE, OB URINE

## 2017-07-27 LAB — URINE CULTURE, OB REFLEX

## 2017-07-31 LAB — OBSTETRIC PANEL, INCLUDING HIV
Antibody Screen: NEGATIVE
BASOS ABS: 0 10*3/uL (ref 0.0–0.2)
Basos: 0 %
EOS (ABSOLUTE): 0 10*3/uL (ref 0.0–0.4)
EOS: 0 %
HEP B S AG: NEGATIVE
HIV Screen 4th Generation wRfx: NONREACTIVE
Hematocrit: 36.8 % (ref 34.0–46.6)
Hemoglobin: 11.9 g/dL (ref 11.1–15.9)
IMMATURE GRANS (ABS): 0 10*3/uL (ref 0.0–0.1)
IMMATURE GRANULOCYTES: 0 %
LYMPHS ABS: 2.6 10*3/uL (ref 0.7–3.1)
LYMPHS: 34 %
MCH: 28.3 pg (ref 26.6–33.0)
MCHC: 32.3 g/dL (ref 31.5–35.7)
MCV: 87 fL (ref 79–97)
MONOS ABS: 0.7 10*3/uL (ref 0.1–0.9)
Monocytes: 9 %
NEUTROS PCT: 57 %
Neutrophils Absolute: 4.2 10*3/uL (ref 1.4–7.0)
PLATELETS: 232 10*3/uL (ref 150–450)
RBC: 4.21 x10E6/uL (ref 3.77–5.28)
RDW: 12.8 % (ref 12.3–15.4)
RPR Ser Ql: NONREACTIVE
Rh Factor: POSITIVE
Rubella Antibodies, IGG: 4.46 index (ref >0.99)
WBC: 7.5 10*3/uL (ref 3.4–10.8)

## 2017-07-31 LAB — HEMOGLOBINOPATHY EVALUATION
HEMOGLOBIN F QUANTITATION: 0 % (ref 0.0–2.0)
HGB A: 97.6 % (ref 96.4–98.8)
HGB C: 0 %
HGB S: 0 %
HGB VARIANT: 0 %
Hemoglobin A2 Quantitation: 2.4 % (ref 1.8–3.2)

## 2017-07-31 LAB — CYSTIC FIBROSIS MUTATION 97: Interpretation: NOT DETECTED

## 2017-08-01 ENCOUNTER — Ambulatory Visit: Payer: Medicaid Other

## 2017-08-04 ENCOUNTER — Encounter: Payer: Medicaid Other | Admitting: Student

## 2017-08-21 ENCOUNTER — Ambulatory Visit (INDEPENDENT_AMBULATORY_CARE_PROVIDER_SITE_OTHER): Payer: Medicaid Other | Admitting: Obstetrics and Gynecology

## 2017-08-21 ENCOUNTER — Encounter: Payer: Self-pay | Admitting: Obstetrics and Gynecology

## 2017-08-21 VITALS — BP 117/85 | HR 93 | Wt 196.1 lb

## 2017-08-21 DIAGNOSIS — Z3480 Encounter for supervision of other normal pregnancy, unspecified trimester: Secondary | ICD-10-CM

## 2017-08-21 NOTE — Patient Instructions (Signed)

## 2017-08-21 NOTE — Progress Notes (Signed)
Subjective:  Kristi Novak is a 22 y.o. G2P1001 at 6531w5d being seen today for ongoing prenatal care.  She is currently monitored for the following issues for this low-risk pregnancy and has Anemia in mother complicating pregnancy, childbirth or puerperium; Hypertropia; Encounter for supervision of normal pregnancy, unspecified, unspecified trimester; Acid reflux; H/O chlamydia infection; and H/O trichomoniasis on their problem list.  Patient reports no complaints.  Contractions: Not present. Vag. Bleeding: None.   . Denies leaking of fluid.   The following portions of the patient's history were reviewed and updated as appropriate: allergies, current medications, past family history, past medical history, past social history, past surgical history and problem list. Problem list updated.  Objective:   Vitals:   08/21/17 1437  BP: 117/85  Pulse: 93  Weight: 196 lb 1.6 oz (89 kg)    Fetal Status:           General:  Alert, oriented and cooperative. Patient is in no acute distress.  Skin: Skin is warm and dry. No rash noted.   Cardiovascular: Normal heart rate noted  Respiratory: Normal respiratory effort, no problems with respiration noted  Abdomen: Soft, gravid, appropriate for gestational age. Pain/Pressure: Absent     Pelvic:  Cervical exam deferred        Extremities: Normal range of motion.  Edema: None  Mental Status: Normal mood and affect. Normal behavior. Normal judgment and thought content.   Urinalysis:      Assessment and Plan:  Pregnancy: G2P1001 at 4631w5d Doing well Panorama today and repeat UC as well  Preterm labor symptoms and general obstetric precautions including but not limited to vaginal bleeding, contractions, leaking of fluid and fetal movement were reviewed in detail with the patient. Please refer to After Visit Summary for other counseling recommendations.  Return in about 4 weeks (around 09/18/2017) for OB visit.   Kristi Novak, Bailey Faiella L, MD

## 2017-08-21 NOTE — Progress Notes (Signed)
Vomiting

## 2017-08-21 NOTE — Addendum Note (Signed)
Addended by: Ellin GoodieLOWE, Cedar Roseman S on: 08/21/2017 03:28 PM   Modules accepted: Orders

## 2017-08-23 LAB — URINE CULTURE

## 2017-08-25 ENCOUNTER — Other Ambulatory Visit: Payer: Self-pay

## 2017-08-25 DIAGNOSIS — R8271 Bacteriuria: Secondary | ICD-10-CM

## 2017-08-25 MED ORDER — AMPICILLIN 500 MG PO CAPS: 500.0000 mg | ORAL_CAPSULE | Freq: Four times a day (QID) | ORAL | 0 refills | Status: DC

## 2017-09-02 ENCOUNTER — Telehealth: Payer: Self-pay

## 2017-09-02 NOTE — Telephone Encounter (Signed)
Pt Rx ampicillin not ava at pt pharmacy e Please advise.

## 2017-09-18 ENCOUNTER — Encounter: Payer: Self-pay | Admitting: Obstetrics and Gynecology

## 2017-09-18 ENCOUNTER — Ambulatory Visit (INDEPENDENT_AMBULATORY_CARE_PROVIDER_SITE_OTHER): Payer: Medicaid Other | Admitting: Obstetrics and Gynecology

## 2017-09-18 VITALS — BP 108/74 | HR 98 | Wt 204.0 lb

## 2017-09-18 DIAGNOSIS — Z3480 Encounter for supervision of other normal pregnancy, unspecified trimester: Secondary | ICD-10-CM

## 2017-09-18 DIAGNOSIS — R8271 Bacteriuria: Secondary | ICD-10-CM

## 2017-09-18 NOTE — Progress Notes (Signed)
   PRENATAL VISIT NOTE  Subjective:  Kristi Novak is a 22 y.o. G2P1001 at 7635w5d being seen today for ongoing prenatal care.  She is currently monitored for the following issues for this low-risk pregnancy and has Anemia in mother complicating pregnancy, childbirth or puerperium; Hypertropia; Encounter for supervision of normal pregnancy, unspecified, unspecified trimester; Acid reflux; H/O chlamydia infection; H/O trichomoniasis; and GBS bacteriuria on their problem list.  Patient reports no complaints.  Contractions: Not present. Vag. Bleeding: None.  Movement: Absent. Denies leaking of fluid.   The following portions of the patient's history were reviewed and updated as appropriate: allergies, current medications, past family history, past medical history, past social history, past surgical history and problem list. Problem list updated.  Objective:   Vitals:   09/18/17 1314  BP: 108/74  Pulse: 98  Weight: 204 lb (92.5 kg)    Fetal Status: Fetal Heart Rate (bpm): 145   Movement: Absent     General:  Alert, oriented and cooperative. Patient is in no acute distress.  Skin: Skin is warm and dry. No rash noted.   Cardiovascular: Normal heart rate noted  Respiratory: Normal respiratory effort, no problems with respiration noted  Abdomen: Soft, gravid, appropriate for gestational age.  Pain/Pressure: Absent     Pelvic: Cervical exam deferred        Extremities: Normal range of motion.     Mental Status: Normal mood and affect. Normal behavior. Normal judgment and thought content.   Assessment and Plan:  Pregnancy: G2P1001 at 6935w5d  1. Supervision of other normal pregnancy, antepartum Patient is doing well without complaints Anatomy ultrasound results reviewed Panorama inconclusive in need of repeat sample- patient declined further testing - US MFM OB COMP + 14 WK; Future  2. GBS bacteriuria Prophylaxis in labor  Preterm labor symptoms and general obstetric precautions  including but not limited to vaginal bleeding, contractions, leaking of fluid and fetal movement were reviewed in detail with the patient. Please refer to After Visit Summary for other counseling recommendations.  Return in about 4 weeks (around 10/16/2017) for ROB.  No future appointments.  Catalina AntiguaPeggy Lamekia Nolden, MD

## 2017-09-29 ENCOUNTER — Encounter (HOSPITAL_COMMUNITY): Payer: Self-pay

## 2017-10-06 ENCOUNTER — Other Ambulatory Visit: Payer: Self-pay | Admitting: Obstetrics and Gynecology

## 2017-10-06 ENCOUNTER — Ambulatory Visit (HOSPITAL_COMMUNITY)
Admission: RE | Admit: 2017-10-06 | Discharge: 2017-10-06 | Disposition: A | Discharge status: Home / Self Care | Payer: Medicaid Other | Source: Ambulatory Visit | Attending: Obstetrics and Gynecology | Admitting: Obstetrics and Gynecology

## 2017-10-06 DIAGNOSIS — Z3A19 19 weeks gestation of pregnancy: Secondary | ICD-10-CM

## 2017-10-06 DIAGNOSIS — O99212 Obesity complicating pregnancy, second trimester: Secondary | ICD-10-CM | POA: Diagnosis not present

## 2017-10-06 DIAGNOSIS — Z3480 Encounter for supervision of other normal pregnancy, unspecified trimester: Secondary | ICD-10-CM

## 2017-10-06 DIAGNOSIS — Z363 Encounter for antenatal screening for malformations: Secondary | ICD-10-CM | POA: Insufficient documentation

## 2017-10-16 ENCOUNTER — Ambulatory Visit (INDEPENDENT_AMBULATORY_CARE_PROVIDER_SITE_OTHER): Payer: Medicaid Other | Admitting: Obstetrics and Gynecology

## 2017-10-16 ENCOUNTER — Encounter: Payer: Self-pay | Admitting: Obstetrics and Gynecology

## 2017-10-16 VITALS — BP 101/68 | HR 88 | Wt 206.0 lb

## 2017-10-16 DIAGNOSIS — Z3480 Encounter for supervision of other normal pregnancy, unspecified trimester: Secondary | ICD-10-CM

## 2017-10-16 DIAGNOSIS — R8271 Bacteriuria: Secondary | ICD-10-CM

## 2017-10-16 DIAGNOSIS — Z8619 Personal history of other infectious and parasitic diseases: Secondary | ICD-10-CM

## 2017-10-16 NOTE — Patient Instructions (Addendum)
Preventing Influenza, Adult Influenza, more commonly known as "the flu," is a viral infection that mainly affects the respiratory tract. The respiratory tract includes structures that help you breathe, such as the lungs, nose, and throat. The flu causes many common cold symptoms, as well as a high fever and body aches. The flu spreads easily from person to person (is contagious). The flu is most common from December through March. This is called flu season.You can catch the flu virus by:  Breathing in droplets from an infected person's cough or sneeze.  Touching something that was recently contaminated with the virus and then touching your mouth, nose, or eyes.  What can I do to lower my risk? You can decrease your risk of getting the flu by:  Getting a flu shot (influenza vaccination) every year. This is the best way to prevent the flu. A flu shot is recommended for everyone age 6 months and older. ? It is best to get a flu shot in the fall, as soon as it is available. Getting a flu shot during winter or spring instead is still a good idea. Flu season can last into early spring. ? Preventing the flu through vaccination requires getting a new flu shot every year. This is because the flu virus changes slightly (mutates) from one year to the next. Even if a flu shot does not completely protect you from all flu virus mutations, it can reduce the severity of your illness and prevent dangerous complications of the flu. ? If you are pregnant, you can and should get a flu shot. ? If you have had a reaction to the shot in the past or if you are allergic to eggs, check with your health care provider before getting a flu shot. ? Sometimes the vaccine is available as a nasal spray. In some years, the nasal spray has not been as effective against the flu virus. Check with your health care provider if you have questions about this.  Practicing good health habits. This is especially important during flu  season. ? Avoid contact with people who are sick with flu or cold symptoms. ? Wash your hands with soap and water often. If soap and water are not available, use hand sanitizer. ? Avoid touching your hands to your face, especially when you have not washed your hands recently. ? Use a disinfectant to clean surfaces at home and at work that may be contaminated with the flu virus. ? Keep your body's disease-fighting system (immune system) in good shape by eating a healthy diet, drinking plenty of fluids, getting enough sleep, and exercising regularly.  If you do get the flu, avoid spreading it to others by:  Staying home until your symptoms have been gone for at least one day.  Covering your mouth and nose with your elbow when you cough or sneeze.  Avoiding close contact with others, especially babies and elderly people.  Why are these changes important? Getting a flu shot and practicing good health habits protects you as well as other people. If you get the flu, your friends, family, and co-workers are also at risk of getting it, because it spreads so easily to others. Each year, about 2 out of every 10 people get the flu. Having the flu can lead to complications, such as pneumonia, ear infection, and sinus infection. The flu also can be deadly, especially for babies, people older than age 65, and people who have serious long-term diseases. How is this treated? Most people recover   from the flu by resting at home and drinking plenty of fluids. However, a prescription antiviral medicine may reduce your flu symptoms and may make your flu go away sooner. This medicine must be started within a few days of getting flu symptoms. You can talk with your health care provider about whether you need an antiviral medicine. Antiviral medicine may be prescribed for people who are at risk for more serious flu symptoms. This includes people who:  Are older than age 52.  Are pregnant.  Have a condition that  makes the flu worse or more dangerous.  Where to find more information:  Centers for Disease Control and Prevention: tsavxtf.com  ItsBlog.fr: InternetEnthusiasts.hu  American Academy of Family Physicians: familydoctor.org/familydoctor/en/kids/vaccines/preventing-the-flu.html Contact a health care provider if:  You have influenza and you develop new symptoms.  You have: ? Chest pain. ? Diarrhea. ? A fever.  Your cough gets worse, or you produce more mucus. Summary  The best way to prevent the flu is to get a flu shot every year in the fall.  Even if you get the flu after you have received the yearly vaccine, your flu may be milder and go away sooner because of your flu shot.  If you get the flu, antiviral medicines that are started with a few days of symptoms may reduce your flu symptoms and may make your flu go away sooner.  You can also help prevent the flu by practicing good health habits. This information is not intended to replace advice given to you by your health care provider. Make sure you discuss any questions you have with your health care provider. Document Released: 01/08/2015 Document Revised: 09/02/2015 Document Reviewed: 09/02/2015 Elsevier Interactive Patient Education  2018 [employer].   BENEFITS OF BREASTFEEDING Many women wonder if they should breastfeed. Research shows that breast milk contains the perfect balance of vitamins, protein and fat that your baby needs to grow. It also contains antibodies that help your baby's immune system to fight off viruses and bacteria and can reduce the risk of sudden infant death syndrome (SIDS). In addition, the colostrum (a fluid secreted from the breast in the first few days after delivery) helps your newborn's digestive system to grow and function well. Breast milk is easier to digest than formula. Also, if your baby is born preterm, breast milk can help to reduce both short- and long-term health  problems. BENEFITS OF BREASTFEEDING FOR MOM . Breastfeeding causes a hormone to be released that helps the uterus to contract and return to its normal size more quickly. . It aids in postpartum weight loss, reduces risk of breast and ovarian cancer, heart disease and rheumatoid arthritis. . It decreases the amount of bleeding after the baby is born. benefits of breastfeeding for baby . Provides comfort and nutrition . Protects baby against - Obesity - Diabetes - Asthma - Childhood cancers - Heart disease - Ear infections - Diarrhea - Pneumonia - Stomach problems - Serious allergies - Skin rashes . Promotes growth and development . Reduces the risk of baby having Sudden Infant Death Syndrome (SIDS) only breastmilk for the first 6 months . Protects baby against diseases/allergies . It's the perfect amount for tiny bellies . It restores baby's energy . Provides the best nutrition for baby . Giving water or formula can make baby more likely to get sick, decrease Mom's milk supply, make baby less content with breastfeeding Skin to Skin After delivery, the staff will place your baby on your chest. This helps with  the following: . Regulates baby's temperature, breathing, heart rate and blood sugar . Increases Mom's milk supply . Promotes bonding . Keeps baby and Mom calm and decreases baby's crying Rooming In Your baby will stay in your room with you for the entire time you are in the hospital. This helps with the following: . Allows Mom to learn baby's feeding cues - Fluttering eyes - Sucking on tongue or hand - Rooting (opens mouth and turns head) - Nuzzling into the breast - Bringing hand to mouth . Allows breastfeeding on demand (when your baby is ready) . Helps baby to be calm and content . Ensures a good milk supply . Prevents complications with breastfeeding . Allows parents to learn to care for baby . Allows you to request assistance with breastfeeding Importance of a  good latch . Increases milk transfer to baby - baby gets enough milk . Ensures you have enough milk for your baby . Decreases nipple soreness . Don't use pacifiers and bottles - these cause baby to suck differently than breastfeeding . Promotes continuation of breastfeeding Risks of Formula Supplementation with Breastfeeding Giving your infant formula in addition to your breast-milk EXCEPT when medically necessary can lead to: Marland Kitchen Decreases your milk supply  . Loss of confidence in yourself for providing baby's nutrition  . Engorgement and possibly mastitis  . Asthma & allergies in the baby BREASTFEEDING FAQS How long should I breastfeed my baby? It is recommended that you provide your baby with breast milk only for the first 6 months and then continue for the first year and longer as desired. During the first few weeks after birth, your baby will need to feed 8-12 times every 24 hours, or every 2-3 hours. They will likely feed for 15-30 minutes. How can I help my baby begin breastfeeding? Babies are born with an instinct to breastfeed. A healthy baby can begin breastfeeding right away without specific help. At the hospital, a nurse (or lactation consultant) will help you begin the process and will give you tips on good positioning. It may be helpful to take a breastfeeding class before you deliver in order to know what to expect. How can I help my baby latch on? In order to assist your baby in latching-on, cup your breast in your hand and stroke your baby's lower lip with your nipple to stimulate your baby's rooting reflex. Your baby will look like he or she is yawning, at which point you should bring the baby towards your breast, while aiming the nipple at the roof of his or her mouth. Remember to bring the baby towards you and not your breast towards the baby. How can I tell if my baby is latched-on? Your baby will have all of your nipple and part of the dark area around the nipple in his or her  mouth and your baby's nose will be touching your breast. You should see or hear the baby swallowing. If the baby is not latched-on properly, start the process over. To remove the suction, insert a clean finger between your breast and the baby's mouth. Should I switch breasts during feeding? After feeding on one side, switch the baby to your other breast. If he or she does not continue feeding - that is OK. Your baby will not necessarily need to feed from both breasts in a single feeding. On the next feeding, start with the other breast for efficiency and comfort. How can I tell if my baby is hungry? When your baby  is hungry, they will nuzzle against your breast, make sucking noises and tongue motions and may put their hands near their mouth. Crying is a late sign of hunger, so you should not wait until this point. When they have received enough milk, they will unlatch from the breast. Is it okay to use a pacifier? Until your baby gets the hang of breastfeeding, experts recommend limiting pacifier usage. If you have questions about this, please contact your pediatrician. What can I do to ensure proper nutrition while breastfeeding? . Make sure that you support your own health and your baby's by eating a healthy, well-balanced diet . Your provider may recommend that you continue to take your prenatal vitamin . Drink plenty of fluids. It is a good rule to drink one glass of water before or after feeding . Alcohol will remain in the breast milk for as long as it will remain in the blood stream. If you choose to have a drink, it is recommended that you wait at least 2 hours before feeding . Moderate amounts of caffeine are OK . Some over-the-counter or prescription medications are not recommended during breastfeeding. Check with your provider if you have questions What types of birth control methods are safe while breastfeeding? Progestin-only methods, including a daily pill, an IUD, the implant and the  injection are safe while breastfeeding. Methods that contain estrogen (such as combination birth control pills, the vaginal ring and the patch) should not be used during the first month of breastfeeding as these can decrease your milk supply.

## 2017-10-16 NOTE — Progress Notes (Signed)
   PRENATAL VISIT NOTE  Subjective:  Kristi Novak is a 22 y.o. G2P1001 at [redacted]w[redacted]d being seen today for ongoing prenatal care.  She is currently monitored for the following issues for this low-risk pregnancy and has Anemia in mother complicating pregnancy, childbirth or puerperium; Hypertropia; Encounter for supervision of normal pregnancy, unspecified, unspecified trimester; Acid reflux; H/O chlamydia infection; H/O trichomoniasis; and GBS bacteriuria on their problem list.  Patient reports baby is moving around a lot, feels like she is kicking a lot.  Contractions: Not present. Vag. Bleeding: None.  Movement: Present. Denies leaking of fluid.   The following portions of the patient's history were reviewed and updated as appropriate: allergies, current medications, past family history, past medical history, past social history, past surgical history and problem list. Problem list updated.  Objective:   Vitals:   10/16/17 1309  BP: 101/68  Pulse: 88  Weight: 206 lb (93.4 kg)    Fetal Status: Fetal Heart Rate (bpm): 160   Movement: Present     General:  Alert, oriented and cooperative. Patient is in no acute distress.  Skin: Skin is warm and dry. No rash noted.   Cardiovascular: Normal heart rate noted  Respiratory: Normal respiratory effort, no problems with respiration noted  Abdomen: Soft, gravid, appropriate for gestational age.  Pain/Pressure: Absent     Pelvic: Cervical exam deferred        Extremities: Normal range of motion.  Edema: None  Mental Status: Normal mood and affect. Normal behavior. Normal judgment and thought content.   Assessment and Plan:  Pregnancy: G2P1001 at [redacted]w[redacted]d  1. Supervision of other normal pregnancy, antepartum Declined flu shot Reviewed breast feeding   2. GBS bacteriuria ppx in labor   Preterm labor symptoms and general obstetric precautions including but not limited to vaginal bleeding, contractions, leaking of fluid and fetal movement were  reviewed in detail with the patient. Please refer to After Visit Summary for other counseling recommendations.  Return in about 4 weeks (around 11/13/2017) for OB visit.  Future Appointments  Date Time Provider Department Center  11/13/2017  4:15 PM Constant, Gigi Gin, MD [facility] None    Conan Bowens, MD

## 2017-10-23 ENCOUNTER — Other Ambulatory Visit: Payer: Self-pay

## 2017-10-23 ENCOUNTER — Encounter (HOSPITAL_COMMUNITY): Payer: Self-pay | Admitting: *Deleted

## 2017-10-23 ENCOUNTER — Inpatient Hospital Stay (HOSPITAL_COMMUNITY)
Admission: AD | Admit: 2017-10-23 | Discharge: 2017-10-23 | Disposition: A | Discharge status: Home / Self Care | Payer: Medicaid Other | Source: Ambulatory Visit | Attending: Obstetrics and Gynecology | Admitting: Obstetrics and Gynecology

## 2017-10-23 DIAGNOSIS — T192XXA Foreign body in vulva and vagina, initial encounter: Secondary | ICD-10-CM | POA: Insufficient documentation

## 2017-10-23 DIAGNOSIS — O4692 Antepartum hemorrhage, unspecified, second trimester: Secondary | ICD-10-CM | POA: Insufficient documentation

## 2017-10-23 DIAGNOSIS — R350 Frequency of micturition: Secondary | ICD-10-CM | POA: Insufficient documentation

## 2017-10-23 DIAGNOSIS — Z3A21 21 weeks gestation of pregnancy: Secondary | ICD-10-CM | POA: Insufficient documentation

## 2017-10-23 DIAGNOSIS — Z049 Encounter for examination and observation for unspecified reason: Secondary | ICD-10-CM

## 2017-10-23 LAB — URINALYSIS, ROUTINE W REFLEX MICROSCOPIC
Bilirubin Urine: NEGATIVE
Glucose, UA: NEGATIVE mg/dL
Ketones, ur: NEGATIVE mg/dL
Nitrite: NEGATIVE
PH: 6 (ref 5.0–8.0)
Protein, ur: NEGATIVE mg/dL
SPECIFIC GRAVITY, URINE: 1.028 (ref 1.005–1.030)

## 2017-10-23 LAB — WET PREP, GENITAL
CLUE CELLS WET PREP: NONE SEEN
SPERM: NONE SEEN
TRICH WET PREP: NONE SEEN
Yeast Wet Prep HPF POC: NONE SEEN

## 2017-10-23 NOTE — Discharge Instructions (Signed)
Second Trimester of Pregnancy The second trimester is from week 13 through week 28, month 4 through 6. This is often the time in pregnancy that you feel your best. Often times, morning sickness has lessened or quit. You may have more energy, and you may get hungry more often. Your unborn baby (fetus) is growing rapidly. At the end of the sixth month, he or she is about 9 inches long and weighs about 1 pounds. You will likely feel the baby move (quickening) between 18 and 20 weeks of pregnancy. Follow these instructions at home:  Avoid all smoking, herbs, and alcohol. Avoid drugs not approved by your doctor.  Do not use any tobacco products, including cigarettes, chewing tobacco, and electronic cigarettes. If you need help quitting, ask your doctor. You may get counseling or other support to help you quit.  Only take medicine as told by your doctor. Some medicines are safe and some are not during pregnancy.  Exercise only as told by your doctor. Stop exercising if you start having cramps.  Eat regular, healthy meals.  Wear a good support bra if your breasts are tender.  Do not use hot tubs, steam rooms, or saunas.  Wear your seat belt when driving.  Avoid raw meat, uncooked cheese, and liter boxes and soil used by cats.  Take your prenatal vitamins.  Take 1500-2000 milligrams of calcium daily starting at the 20th week of pregnancy until you deliver your baby.  Try taking medicine that helps you poop (stool softener) as needed, and if your doctor approves. Eat more fiber by eating fresh fruit, vegetables, and whole grains. Drink enough fluids to keep your pee (urine) clear or pale yellow.  Take warm water baths (sitz baths) to soothe pain or discomfort caused by hemorrhoids. Use hemorrhoid cream if your doctor approves.  If you have puffy, bulging veins (varicose veins), wear support hose. Raise (elevate) your feet for 15 minutes, 3-4 times a day. Limit salt in your diet.  Avoid heavy  lifting, wear low heals, and sit up straight.  Rest with your legs raised if you have leg cramps or low back pain.  Visit your dentist if you have not gone during your pregnancy. Use a soft toothbrush to brush your teeth. Be gentle when you floss.  You can have sex (intercourse) unless your doctor tells you not to.  Go to your doctor visits. Get help if:  You feel dizzy.  You have mild cramps or pressure in your lower belly (abdomen).  You have a nagging pain in your belly area.  You continue to feel sick to your stomach (nauseous), throw up (vomit), or have watery poop (diarrhea).  You have bad smelling fluid coming from your vagina.  You have pain with peeing (urination). Get help right away if:  You have a fever.  You are leaking fluid from your vagina.  You have spotting or bleeding from your vagina.  You have severe belly cramping or pain.  You lose or gain weight rapidly.  You have trouble catching your breath and have chest pain.  You notice sudden or extreme puffiness (swelling) of your face, hands, ankles, feet, or legs.  You have not felt the baby move in over an hour.  You have severe headaches that do not go away with medicine.  You have vision changes. This information is not intended to replace advice given to you by your health care provider. Make sure you discuss any questions you have with your health care   provider. Document Released: 03/20/2009 Document Revised: 06/01/2015 Document Reviewed: 02/25/2012 Elsevier Interactive Patient Education  2017 Elsevier Inc.  

## 2017-10-23 NOTE — MAU Provider Note (Signed)
Chief Complaint: Vaginal Bleeding  First Provider Initiated Contact with Patient 10/23/17 1454    SUBJECTIVE HPI: Kristi Novak is a 22 y.o. G2P1001 at [redacted]w[redacted]d who presents to Maternity Admissions reporting seeing small amount of blood with wiping this afternoon.   Anterior placenta and Nml cervical length 3.4 cm per Anatomy US 10/06/17  A pos  Associated signs and symptoms: Pos for urinary frequency. Neg for fever, chills, abdominal pain, urgency, vaginal discharge, dysuria, blood in stool.    Past Medical History:  Diagnosis Date  . H/O chlamydia infection   . H/O trichomoniasis   . Hearing loss   . 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 Hypertropia RIGHT EYE   OB History  Gravida Para Term Preterm AB Living  2 1 1     1   SAB TAB Ectopic Multiple Live Births          1    # Outcome Date GA Lbr Len/2nd Weight Sex Delivery Anes PTL Lv  2 Current           1 Term 12/20/10 [redacted]w[redacted]d 139:45 / 00:45 2971 g M Vag-Spont EPI  LIV   Past Surgical History:  Procedure Laterality Date  . EYE SURGERY  05-01-2005  [facility]   LEFT EYE  AND  1999 BILATERAL EYES 1999  . EYE SURGERY    . MEDIAN RECTUS REPAIR  08/21/2011   Procedure: MEDIAN RECTUS REPAIR;  Surgeon: Corinda Gubler, MD;  Location: [facility];  Service: Ophthalmology;  Laterality: Right;  inferior oblique myectomy  . TYMPANOSTOMY TUBE PLACEMENT  AGE 75   AND ADENOIDECTOMY  . WISDOM TOOTH EXTRACTION     ORAL SURGEON OFFICE   Social History   Socioeconomic History  . Marital status: Single    Spouse name: Not on file  . Number of children: 1  . Years of education: Not on file  . Highest education level: Not on file  Occupational History  . Occupation: Consulting civil engineer  Social Needs  . Financial resource strain: Not on file  . Food insecurity:    Worry: Not on file    Inability: Not on file  . Transportation needs:    Medical: Not on file    Non-medical: Not on file  Tobacco Use  . Smoking  status: Never Smoker  . Smokeless tobacco: Never Used  Substance and Sexual Activity  . Alcohol use: No  . Drug use: No  . Sexual activity: Yes    Partners: Male    Birth control/protection: None  Lifestyle  . Physical activity:    Days per week: Not on file    Minutes per session: Not on file  . Stress: Not on file  Relationships  . Social connections:    Talks on phone: Not on file    Gets together: Not on file    Attends religious service: Not on file    Active member of club or organization: Not on file    Attends meetings of clubs or organizations: Not on file    Relationship status: Not on file  . Intimate partner violence:    Fear of current or ex partner: Not on file    Emotionally abused: Not on file    Physically abused: Not on file    Forced sexual activity: Not on file  Other Topics Concern  . Not on file  Social History Narrative   Lives with mother, grandmother and child.  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    Family History  Problem Relation Age of Onset  . Cancer Maternal Grandmother        breast  . Hypertension Other   . Healthy Mother   . Healthy Father    No current facility-administered medications on file prior to encounter.    Current Outpatient Medications on File Prior to Encounter  Medication Sig Dispense Refill  . Prenatal Vit-Fe Fumarate-FA (PRENATAL MULTIVITAMIN) TABS tablet Take 1 tablet by mouth daily at 12 noon.    . Prenatal Vit-Fe Phos-FA-Omega (VITAFOL GUMMIES) 3.33-0.333-34.8 MG CHEW Chew 1 tablet by mouth daily. (Patient not taking: Reported on 10/23/2017) 90 tablet 5   No Known Allergies  I have reviewed patient's Past Medical Hx, Surgical Hx, Family Hx, Social Hx, medications and allergies.   Review of Systems  Constitutional: Negative for chills and fever.  Gastrointestinal: Negative for abdominal pain and blood in stool.  Genitourinary: Positive for frequency and vaginal bleeding. Negative  for difficulty urinating, dysuria, flank pain, hematuria and vaginal discharge.    OBJECTIVE Patient Vitals for the past 24 hrs:  BP Temp Temp src Pulse Resp SpO2 Height Weight  10/23/17 1610 116/72 98.9 F (37.2 C) Oral 75 18 100 % - -  10/23/17 1438 118/72 99.3 F (37.4 C) Oral (!) 101 17 100 % - -  10/23/17 1430 - - - - - - 5\' 4"  (1.626 m) 93.8 kg   Constitutional: Well-developed, well-nourished female in no acute distress.  Cardiovascular: normal rate Respiratory: normal rate and effort.  GI: Abd soft, non-tender, gravid appropriate for gestational age. Pos BS x 4 MS: Extremities nontender, no edema, normal ROM Neurologic: Alert and oriented x 4.  GU: Neg CVAT.  SPECULUM EXAM: NEFG, moderate physiologic discharge, no blood noted, cervix clean. Fragment of what appears to be latex C/W a condom tip.   BIMANUAL: No CMT. Dilation: Closed Effacement (%): Thick Cervical Position: Posterior Station: Ballotable Presentation: Undeterminable Exam by:: Dorathy Kinsman, CNM   LAB RESULTS Results for orders placed or performed during the hospital encounter of 10/23/17 (from the past 24 hour(s))  Urinalysis, Routine w reflex microscopic     Status: Abnormal   Collection Time: 10/23/17  2:50 PM  Result Value Ref Range   Color, Urine YELLOW YELLOW   APPearance HAZY (A) CLEAR   Specific Gravity, Urine 1.028 1.005 - 1.030   pH 6.0 5.0 - 8.0   Glucose, UA NEGATIVE NEGATIVE mg/dL   Hgb urine dipstick SMALL (A) NEGATIVE   Bilirubin Urine NEGATIVE NEGATIVE   Ketones, ur NEGATIVE NEGATIVE mg/dL   Protein, ur NEGATIVE NEGATIVE mg/dL   Nitrite NEGATIVE NEGATIVE   Leukocytes, UA SMALL (A) NEGATIVE   RBC / HPF 0-5 0 - 5 RBC/hpf   WBC, UA 0-5 0 - 5 WBC/hpf   Bacteria, UA RARE (A) NONE SEEN   Squamous Epithelial / LPF 11-20 0 - 5   Mucus PRESENT   Wet prep, genital     Status: Abnormal   Collection Time: 10/23/17  3:10 PM  Result Value Ref Range   Yeast Wet Prep HPF POC NONE SEEN NONE SEEN    Trich, Wet Prep NONE SEEN NONE SEEN   Clue Cells Wet Prep HPF POC NONE SEEN NONE SEEN   WBC, Wet Prep HPF POC MANY (A) NONE SEEN   Sperm NONE SEEN     IMAGING NA  MAU COURSE Orders Placed This Encounter  Procedures  . Wet prep, genital  .  Culture, OB Urine  . Urinalysis, Routine w reflex microscopic  . Discharge patient   No orders of the defined types were placed in this encounter.   MDM - No VB on spec exam. Cervix long and closed. Doubt pt had VB. Small Hgb on urine dip and neg RBC. Unsure if blood was from hematuria. Will culture urine due to frequency of urination. Pt reassured.  - Condom fragment found. No evidence of obvious infection. GC/Chlamydia cultures pending.     ASSESSMENT 1. Vaginal foreign body, initial encounter   2. Suspected condition not found   3. Urinary frequency     PLAN Discharge home in stable condition. VB precautions Follow-up Information    [facility] Follow up on 11/13/2017.   Why:  as schedule or sooner as needed.  Contact information: [address] [place] [address] [phone]       [facility] Follow up.   Why:  as needed in pregnancy emergencies Contact information: [address] [id number] mc [place] [address] [phone]         Allergies as of 10/23/2017   No Known Allergies     Medication List    STOP taking these medications   VITAFOL GUMMIES 3.33-0.333-34.8 MG Chew     TAKE these medications   prenatal multivitamin Tabs tablet Take 1 tablet by mouth daily at 12 noon.        Katrinka Blazing, [place], CNM 10/23/2017  4:16 PM

## 2017-10-23 NOTE — MAU Note (Signed)
Pt presents with c/o VB that began today.  States spotting with wiping.  Denies abdominal pain or cramping.

## 2017-10-24 LAB — GC/CHLAMYDIA PROBE AMP (~~LOC~~) NOT AT ARMC
CHLAMYDIA, DNA PROBE: NEGATIVE
Neisseria Gonorrhea: NEGATIVE

## 2017-10-25 LAB — CULTURE, OB URINE
Culture: >=100000 — AB
Special Requests: NORMAL

## 2017-10-26 ENCOUNTER — Telehealth: Payer: Self-pay | Admitting: Student

## 2017-10-26 DIAGNOSIS — R8271 Bacteriuria: Secondary | ICD-10-CM

## 2017-10-26 MED ORDER — AMOXICILLIN 500 MG PO CAPS: 500.0000 mg | ORAL_CAPSULE | Freq: Three times a day (TID) | ORAL | 0 refills | Status: AC

## 2017-10-26 NOTE — Telephone Encounter (Signed)
Attempting to contact regarding urine culture. Left voicemail.   Judeth Horn, NP

## 2017-10-26 NOTE — Telephone Encounter (Signed)
Pt returned phone call. Verified by name & DOB. Notified of positive urine culture. Verified NKDA.  Per records, pt had rx for GBS bacteruria in August but pharmacy didn't have in stock. Pt states she doesn't recall if she ever got it filled.   Judeth Horn, NP

## 2017-11-13 ENCOUNTER — Encounter: Payer: Medicaid Other | Admitting: Obstetrics and Gynecology

## 2017-11-18 ENCOUNTER — Ambulatory Visit (INDEPENDENT_AMBULATORY_CARE_PROVIDER_SITE_OTHER): Payer: Medicaid Other | Admitting: Obstetrics and Gynecology

## 2017-11-18 ENCOUNTER — Encounter: Payer: Self-pay | Admitting: Obstetrics and Gynecology

## 2017-11-18 VITALS — BP 104/70 | HR 96 | Wt 213.0 lb

## 2017-11-18 DIAGNOSIS — Z3A25 25 weeks gestation of pregnancy: Secondary | ICD-10-CM

## 2017-11-18 DIAGNOSIS — O26892 Other specified pregnancy related conditions, second trimester: Secondary | ICD-10-CM

## 2017-11-18 DIAGNOSIS — R8271 Bacteriuria: Secondary | ICD-10-CM

## 2017-11-18 DIAGNOSIS — Z3480 Encounter for supervision of other normal pregnancy, unspecified trimester: Secondary | ICD-10-CM

## 2017-11-18 NOTE — Progress Notes (Signed)
   PRENATAL VISIT NOTE  Subjective:  TENNILLE Novak is a 22 y.o. G2P1001 at [redacted]w[redacted]d being seen today for ongoing prenatal care.  She is currently monitored for the following issues for this low-risk pregnancy and has Anemia in mother complicating pregnancy, childbirth or puerperium; Hypertropia; Encounter for supervision of normal pregnancy, unspecified, unspecified trimester; Acid reflux; H/O chlamydia infection; H/O trichomoniasis; and GBS bacteriuria on their problem list.  Patient reports no complaints.  Contractions: Not present. Vag. Bleeding: None.  Movement: Present. Denies leaking of fluid.   The following portions of the patient's history were reviewed and updated as appropriate: allergies, current medications, past family history, past medical history, past social history, past surgical history and problem list. Problem list updated.  Objective:   Vitals:   11/18/17 1046  BP: 104/70  Pulse: 96  Weight: 213 lb (96.6 kg)    Fetal Status: Fetal Heart Rate (bpm): 146 Fundal Height: 25 cm Movement: Present     General:  Alert, oriented and cooperative. Patient is in no acute distress.  Skin: Skin is warm and dry. No rash noted.   Cardiovascular: Normal heart rate noted  Respiratory: Normal respiratory effort, no problems with respiration noted  Abdomen: Soft, gravid, appropriate for gestational age.  Pain/Pressure: Absent     Pelvic: Cervical exam deferred        Extremities: Normal range of motion.  Edema: None  Mental Status: Normal mood and affect. Normal behavior. Normal judgment and thought content.   Assessment and Plan:  Pregnancy: G2P1001 at [redacted]w[redacted]d  1. Supervision of other normal pregnancy, antepartum Patient is doing well without complaints Third trimester labs and glucola next visit Discussed tdap   2. GBS bacteriuria Will receive prophylaxis in labor  Preterm labor symptoms and general obstetric precautions including but not limited to vaginal bleeding,  contractions, leaking of fluid and fetal movement were reviewed in detail with the patient. Please refer to After Visit Summary for other counseling recommendations.  Return in about 3 weeks (around 12/09/2017) for ROB, 2 hr glucola next visit.  No future appointments.  Catalina Antigua, MD

## 2017-11-18 NOTE — Progress Notes (Signed)
Pt is here for ROB. G2P1 7446w3d.

## 2017-11-18 NOTE — Patient Instructions (Signed)
DTaP Vaccine (Diphtheria, Tetanus, and Pertussis): What You Need to Know 1. Why get vaccinated? Diphtheria, tetanus, and pertussis are serious diseases caused by bacteria. Diphtheria and pertussis are spread from person to person. Tetanus enters the body through cuts or wounds. DIPHTHERIA causes a thick covering in the back of the throat.  It can lead to breathing problems, paralysis, heart failure, and even death.  TETANUS (Lockjaw) causes painful tightening of the muscles, usually all over the body.  It can lead to "locking" of the jaw so the victim cannot open his mouth or swallow. Tetanus leads to death in up to 2 out of 10 cases.  PERTUSSIS (Whooping Cough) causes coughing spells so bad that it is hard for infants to eat, drink, or breathe. These spells can last for weeks.  It can lead to pneumonia, seizures (jerking and staring spells), brain damage, and death.  Diphtheria, tetanus, and pertussis vaccine (DTaP) can help prevent these diseases. Most children who are vaccinated with DTaP will be protected throughout childhood. Many more children would get these diseases if we stopped vaccinating. DTaP is a safer version of an older vaccine called DTP. DTP is no longer used in the [place]. 2. Who should get DTaP vaccine and when? Children should get 5 doses of DTaP vaccine, one dose at each of the following ages:  2 months  4 months  6 months  15-18 months  4-6 years  DTaP may be given at the same time as other vaccines. 3. Some children should not get DTaP vaccine or should wait  Children with minor illnesses, such as a cold, may be vaccinated. But children who are moderately or severely ill should usually wait until they recover before getting DTaP vaccine.  Any child who had a life-threatening allergic reaction after a dose of DTaP should not get another dose.  Any child who suffered a brain or nervous system disease within 7 days after a dose of DTaP should not get  another dose.  Talk with your doctor if your child: ? had a seizure or collapsed after a dose of DTaP, ? cried non-stop for 3 hours or more after a dose of DTaP, ? had a fever over 105F after a dose of DTaP. Ask your doctor for more information. Some of these children should not get another dose of pertussis vaccine, but may get a vaccine without pertussis, called DT. 4. Older children and adults DTaP is not licensed for adolescents, adults, or children 7 years of age and older. But older people still need protection. A vaccine called Tdap is similar to DTaP. A single dose of Tdap is recommended for people 11 through 22 years of age. Another vaccine, called Td, protects against tetanus and diphtheria, but not pertussis. It is recommended every 10 years. There are separate Vaccine Information Statements for these vaccines. 5. What are the risks from DTaP vaccine? Getting diphtheria, tetanus, or pertussis disease is much riskier than getting DTaP vaccine. However, a vaccine, like any medicine, is capable of causing serious problems, such as severe allergic reactions. The risk of DTaP vaccine causing serious harm, or death, is extremely small. Mild problems (common)  Fever (up to about 1 child in 4)  Redness or swelling where the shot was given (up to about 1 child in 4)  Soreness or tenderness where the shot was given (up to about 1 child in 4) These problems occur more often after the 4th and 5th doses of the DTaP series than after   earlier doses. Sometimes the 4th or 5th dose of DTaP vaccine is followed by swelling of the entire arm or leg in which the shot was given, lasting 1-7 days (up to about 1 child in 30). Other mild problems include:  Fussiness (up to about 1 child in 3)  Tiredness or poor appetite (up to about 1 child in 10)  Vomiting (up to about 1 child in 50) These problems generally occur 1-3 days after the shot. Moderate problems (uncommon)  Seizure (jerking or staring)  (about 1 child out of 14,000)  Non-stop crying, for 3 hours or more (up to about 1 child out of 1,000)  High fever, over 105F (about 1 child out of 16,000) Severe problems (very rare)  Serious allergic reaction (less than 1 out of a million doses)  Several other severe problems have been reported after DTaP vaccine. These include: ? Long-term seizures, coma, or lowered consciousness ? Permanent brain damage. These are so rare it is hard to tell if they are caused by the vaccine. Controlling fever is especially important for children who have had seizures, for any reason. It is also important if another family member has had seizures. You can reduce fever and pain by giving your child an aspirin-free pain reliever when the shot is given, and for the next 24 hours, following the package instructions. 6. What if there is a serious reaction? What should I look for? Look for anything that concerns you, such as signs of a severe allergic reaction, very high fever, or behavior changes. Signs of a severe allergic reaction can include hives, swelling of the face and throat, difficulty breathing, a fast heartbeat, dizziness, and weakness. These would start a few minutes to a few hours after the vaccination. What should I do?  If you think it is a severe allergic reaction or other emergency that can't wait, call 9-1-1 or get the person to the nearest hospital. Otherwise, call your doctor.  Afterward, the reaction should be reported to the Vaccine Adverse Event Reporting System (VAERS). Your doctor might file this report, or you can do it yourself through the VAERS web site at www.vaers.hhs.gov, or by calling 1-800-822-7967. ? VAERS is only for reporting reactions. They do not give medical advice. 7. The National Vaccine Injury Compensation Program The National Vaccine Injury Compensation Program (VICP) is a federal program that was created to compensate people who may have been injured by certain  vaccines. Persons who believe they may have been injured by a vaccine can learn about the program and about filing a claim by calling 1-800-338-2382 or visiting the VICP website at www.hrsa.gov/vaccinecompensation. 8. How can I learn more?  Ask your doctor.  Call your local or state health department.  Contact the Centers for Disease Control and Prevention (CDC): ? Call 1-800-232-4636 (1-800-CDC-INFO) or ? Visit CDC's website at www.cdc.gov/vaccines CDC DTaP Vaccine (Diphtheria, Tetanus, and Pertussis) VIS (05/23/05) This information is not intended to replace advice given to you by your health care provider. Make sure you discuss any questions you have with your health care provider. Document Released: 10/21/2005 Document Revised: 09/14/2015 Document Reviewed: 09/14/2015 Elsevier Interactive Patient Education  2017 Elsevier Inc.  

## 2017-12-10 ENCOUNTER — Encounter: Payer: Self-pay | Admitting: Obstetrics and Gynecology

## 2017-12-10 ENCOUNTER — Other Ambulatory Visit: Payer: Medicaid Other

## 2017-12-10 ENCOUNTER — Other Ambulatory Visit: Payer: Self-pay

## 2017-12-10 ENCOUNTER — Ambulatory Visit (INDEPENDENT_AMBULATORY_CARE_PROVIDER_SITE_OTHER): Payer: Medicaid Other | Admitting: Obstetrics and Gynecology

## 2017-12-10 VITALS — BP 125/72 | HR 85 | Wt 223.0 lb

## 2017-12-10 DIAGNOSIS — R8271 Bacteriuria: Secondary | ICD-10-CM

## 2017-12-10 DIAGNOSIS — Z3483 Encounter for supervision of other normal pregnancy, third trimester: Secondary | ICD-10-CM

## 2017-12-10 DIAGNOSIS — Z3480 Encounter for supervision of other normal pregnancy, unspecified trimester: Secondary | ICD-10-CM

## 2017-12-10 NOTE — Progress Notes (Signed)
   PRENATAL VISIT NOTE  Subjective:  Kristi Novak is a 22 y.o. G2P1001 at 3249w4d being seen today for ongoing prenatal care.  She is currently monitored for the following issues for this low-risk pregnancy and has Hypertropia; Encounter for supervision of normal pregnancy, unspecified, unspecified trimester; Acid reflux; H/O chlamydia infection; H/O trichomoniasis; and GBS bacteriuria on their problem list.  Patient reports no complaints.  Contractions: Not present. Vag. Bleeding: None.  Movement: Present. Denies leaking of fluid.   The following portions of the patient's history were reviewed and updated as appropriate: allergies, current medications, past family history, past medical history, past social history, past surgical history and problem list. Problem list updated.  Objective:   Vitals:   12/10/17 0839  BP: 125/72  Pulse: 85  Weight: 223 lb (101.2 kg)    Fetal Status: Fetal Heart Rate (bpm): 144   Movement: Present     General:  Alert, oriented and cooperative. Patient is in no acute distress.  Skin: Skin is warm and dry. No rash noted.   Cardiovascular: Normal heart rate noted  Respiratory: Normal respiratory effort, no problems with respiration noted  Abdomen: Soft, gravid, appropriate for gestational age.  Pain/Pressure: Present     Pelvic: Cervical exam deferred        Extremities: Normal range of motion.  Edema: None  Mental Status: Normal mood and affect. Normal behavior. Normal judgment and thought content.   Assessment and Plan:  Pregnancy: G2P1001 at 5449w4d  1. Supervision of other normal pregnancy, antepartum Patient is doing well without complaints Third trimester labs today Patient declined tdap  2. GBS bacteriuria Will receive prophylaxis in labor  Preterm labor symptoms and general obstetric precautions including but not limited to vaginal bleeding, contractions, leaking of fluid and fetal movement were reviewed in detail with the patient. Please  refer to After Visit Summary for other counseling recommendations.  Return in about 2 weeks (around 12/24/2017) for ROB.  No future appointments.  Catalina AntiguaPeggy Schawn Byas, MD

## 2017-12-10 NOTE — Progress Notes (Signed)
ROB/GTT.  Declined TDAP. 

## 2017-12-11 LAB — CBC
HEMATOCRIT: 32.1 % — AB (ref 34.0–46.6)
Hemoglobin: 10.3 g/dL — ABNORMAL LOW (ref 11.1–15.9)
MCH: 27.8 pg (ref 26.6–33.0)
MCHC: 32.1 g/dL (ref 31.5–35.7)
MCV: 87 fL (ref 79–97)
Platelets: 263 10*3/uL (ref 150–450)
RBC: 3.7 x10E6/uL — AB (ref 3.77–5.28)
RDW: 12.3 % (ref 12.3–15.4)
WBC: 8.4 10*3/uL (ref 3.4–10.8)

## 2017-12-11 LAB — GLUCOSE TOLERANCE, 2 HOURS W/ 1HR
GLUCOSE, 2 HOUR: 90 mg/dL (ref 65–152)
Glucose, 1 hour: 112 mg/dL (ref 65–179)
Glucose, Fasting: 72 mg/dL (ref 65–91)

## 2017-12-11 LAB — HIV ANTIBODY (ROUTINE TESTING W REFLEX): HIV Screen 4th Generation wRfx: NONREACTIVE

## 2017-12-11 LAB — RPR: RPR Ser Ql: NONREACTIVE

## 2017-12-24 ENCOUNTER — Encounter: Payer: Self-pay | Admitting: Certified Nurse Midwife

## 2017-12-24 ENCOUNTER — Ambulatory Visit (INDEPENDENT_AMBULATORY_CARE_PROVIDER_SITE_OTHER): Payer: Medicaid Other | Admitting: Certified Nurse Midwife

## 2017-12-24 ENCOUNTER — Encounter: Payer: Medicaid Other | Admitting: Obstetrics and Gynecology

## 2017-12-24 VITALS — BP 112/69 | HR 93 | Wt 224.0 lb

## 2017-12-24 DIAGNOSIS — Z3483 Encounter for supervision of other normal pregnancy, third trimester: Secondary | ICD-10-CM

## 2017-12-24 DIAGNOSIS — R8271 Bacteriuria: Secondary | ICD-10-CM

## 2017-12-24 DIAGNOSIS — O26899 Other specified pregnancy related conditions, unspecified trimester: Secondary | ICD-10-CM

## 2017-12-24 DIAGNOSIS — R102 Pelvic and perineal pain: Secondary | ICD-10-CM

## 2017-12-24 DIAGNOSIS — Z3480 Encounter for supervision of other normal pregnancy, unspecified trimester: Secondary | ICD-10-CM

## 2017-12-24 DIAGNOSIS — O26893 Other specified pregnancy related conditions, third trimester: Secondary | ICD-10-CM

## 2017-12-24 MED ORDER — COMFORT FIT MATERNITY SUPP LG MISC: 1.0000 [IU] | Freq: Every day | 0 refills | Status: DC

## 2017-12-24 NOTE — Patient Instructions (Signed)

## 2017-12-25 NOTE — Progress Notes (Signed)
   PRENATAL VISIT NOTE  Subjective:  Kristi Novak is a 22 y.o. G2P1001 at 2169w5d being seen today for ongoing prenatal care.  She is currently monitored for the following issues for this low-risk pregnancy and has Hypertropia; Encounter for supervision of normal pregnancy, unspecified, unspecified trimester; Acid reflux; H/O chlamydia infection; H/O trichomoniasis; and GBS bacteriuria on their problem list.  Patient reports lower abdominal and pelvic pain with movement .  Contractions: Not present. Vag. Bleeding: None.  Movement: Present. Denies leaking of fluid.   The following portions of the patient's history were reviewed and updated as appropriate: allergies, current medications, past family history, past medical history, past social history, past surgical history and problem list. Problem list updated.  Objective:  Vitals:   12/24/17 1559  BP: 112/69  Pulse: 93  Weight: 224 lb (101.6 kg)    Fetal Status: Fetal Heart Rate (bpm): 140 Fundal Height: 29 cm Movement: Present     General:  Alert, oriented and cooperative. Patient is in no acute distress.  Skin: Skin is warm and dry. No rash noted.   Cardiovascular: Normal heart rate noted  Respiratory: Normal respiratory effort, no problems with respiration noted  Abdomen: Soft, gravid, appropriate for gestational age.  Pain/Pressure: Present     Pelvic: Cervical exam deferred        Extremities: Normal range of motion.  Edema: None  Mental Status: Normal mood and affect. Normal behavior. Normal judgment and thought content.   Assessment and Plan:  Pregnancy: G2P1001 at 2269w5d  1. Supervision of other normal pregnancy, antepartum - Routine prenatal  - Anticipatory guidance on upcoming appointments - Requesting to be induced at 37 weeks, educated and discussed inductions prior to 39-40 weeks are medical necessary inductions and not elective, patient verbalized understanding   2. Pain of round ligament during pregnancy -  Occasional sharp lower abdominal and pelvic pain with movement, standing up from laying down, and repositioning in bed  - Denies contractions or cramping   - Elastic Bandages & Supports (COMFORT FIT MATERNITY SUPP LG) MISC; 1 Units by Does not apply route daily.  Dispense: 1 each; Refill: 0  3. GBS bacteriuria - Treat in labor   Preterm labor symptoms and general obstetric precautions including but not limited to vaginal bleeding, contractions, leaking of fluid and fetal movement were reviewed in detail with the patient. Please refer to After Visit Summary for other counseling recommendations.  Return in about 2 weeks (around 01/07/2018) for ROB.  Future Appointments  Date Time Provider Department Center  01/09/2018  9:45 AM Brock BadHarper, Charles A, MD [facility] None    Sharyon CableVeronica C Dorinda Stehr, CNM

## 2018-01-07 NOTE — L&D Delivery Note (Addendum)
Delivery Note At 2126 a viable girl infant was delivered via SVD, presentation: ROA. APGAR: 9, 9; weight pending.   Placenta status: spontaneously delivered, intact via gentle cord traction. Cord: 3 vessel. Cord ph N/A. Complications none.  Anesthesia: epidural Lacerations: 1st degree periurethral Suture used for repair: 3.0 SH Est. Blood Loss (mL): 254  Mom to postpartum.  Baby to Couplet care / Skin to Skin.  The above was performed under my direct supervision and guidance.  Jacklyn Shell    Kellogg, Alexandra L, DO 02/12/2018 9:53 PM

## 2018-01-09 ENCOUNTER — Ambulatory Visit (INDEPENDENT_AMBULATORY_CARE_PROVIDER_SITE_OTHER): Payer: Medicaid Other | Admitting: Obstetrics

## 2018-01-09 ENCOUNTER — Encounter: Payer: Self-pay | Admitting: Obstetrics

## 2018-01-09 DIAGNOSIS — Z3482 Encounter for supervision of other normal pregnancy, second trimester: Secondary | ICD-10-CM

## 2018-01-09 DIAGNOSIS — Z3480 Encounter for supervision of other normal pregnancy, unspecified trimester: Secondary | ICD-10-CM

## 2018-01-09 NOTE — Progress Notes (Signed)
Subjective:  Kristi Novak is a 23 y.o. G2P1001 at [redacted]w[redacted]d being seen today for ongoing prenatal care.  She is currently monitored for the following issues for this low-risk pregnancy and has Hypertropia; Encounter for supervision of normal pregnancy, unspecified, unspecified trimester; Acid reflux; H/O chlamydia infection; H/O trichomoniasis; and GBS bacteriuria on their problem list.  Patient reports no complaints.  Contractions: Not present. Vag. Bleeding: None.  Movement: Present. Denies leaking of fluid.   The following portions of the patient's history were reviewed and updated as appropriate: allergies, current medications, past family history, past medical history, past social history, past surgical history and problem list. Problem list updated.  Objective:   Vitals:   01/09/18 0949  BP: 109/60  Pulse: (!) 128  Weight: 222 lb 9.6 oz (101 kg)    Fetal Status:     Movement: Present     General:  Alert, oriented and cooperative. Patient is in no acute distress.  Skin: Skin is warm and dry. No rash noted.   Cardiovascular: Normal heart rate noted  Respiratory: Normal respiratory effort, no problems with respiration noted  Abdomen: Soft, gravid, appropriate for gestational age. Pain/Pressure: Present     Pelvic:  Cervical exam deferred        Extremities: Normal range of motion.  Edema: None  Mental Status: Normal mood and affect. Normal behavior. Normal judgment and thought content.   Urinalysis:      Assessment and Plan:  Pregnancy: G2P1001 at [redacted]w[redacted]d  1. Supervision of other normal pregnancy, antepartum   Preterm labor symptoms and general obstetric precautions including but not limited to vaginal bleeding, contractions, leaking of fluid and fetal movement were reviewed in detail with the patient. Please refer to After Visit Summary for other counseling recommendations.  Return in about 2 weeks (around 01/23/2018) for ROB.   Brock Bad, MD

## 2018-01-22 ENCOUNTER — Ambulatory Visit (INDEPENDENT_AMBULATORY_CARE_PROVIDER_SITE_OTHER): Payer: Medicaid Other | Admitting: Obstetrics

## 2018-01-22 ENCOUNTER — Encounter: Payer: Self-pay | Admitting: Obstetrics

## 2018-01-22 VITALS — BP 125/73 | HR 101 | Wt 228.8 lb

## 2018-01-22 DIAGNOSIS — Z3483 Encounter for supervision of other normal pregnancy, third trimester: Secondary | ICD-10-CM

## 2018-01-22 DIAGNOSIS — Z3A34 34 weeks gestation of pregnancy: Secondary | ICD-10-CM

## 2018-01-22 DIAGNOSIS — Z3480 Encounter for supervision of other normal pregnancy, unspecified trimester: Secondary | ICD-10-CM

## 2018-01-22 DIAGNOSIS — R8271 Bacteriuria: Secondary | ICD-10-CM

## 2018-01-22 NOTE — Progress Notes (Signed)
Subjective:  Kristi Novak is a 23 y.o. G2P1001 at [redacted]w[redacted]d being seen today for ongoing prenatal care.  She is currently monitored for the following issues for this low-risk pregnancy and has Hypertropia; Encounter for supervision of normal pregnancy, unspecified, unspecified trimester; Acid reflux; H/O chlamydia infection; H/O trichomoniasis; and GBS bacteriuria on their problem list.  Patient reports nasal congestion and cough.  Contractions: Not present. Vag. Bleeding: None.  Movement: Present. Denies leaking of fluid.   The following portions of the patient's history were reviewed and updated as appropriate: allergies, current medications, past family history, past medical history, past social history, past surgical history and problem list. Problem list updated.  Objective:   Vitals:   01/22/18 1436  BP: 125/73  Pulse: (!) 101  Weight: 228 lb 12.8 oz (103.8 kg)    Fetal Status:     Movement: Present     General:  Alert, oriented and cooperative. Patient is in no acute distress.  Skin: Skin is warm and dry. No rash noted.   Cardiovascular: Normal heart rate noted  Respiratory: Normal respiratory effort, no problems with respiration noted  Abdomen: Soft, gravid, appropriate for gestational age. Pain/Pressure: Present     Pelvic:  Cervical exam deferred        Extremities: Normal range of motion.  Edema: Trace  Mental Status: Normal mood and affect. Normal behavior. Normal judgment and thought content.   Urinalysis:      Assessment and Plan:  Pregnancy: G2P1001 at [redacted]w[redacted]d  1. Supervision of other normal pregnancy, antepartum  2. GBS bacteriuria - treat in labor  Preterm labor symptoms and general obstetric precautions including but not limited to vaginal bleeding, contractions, leaking of fluid and fetal movement were reviewed in detail with the patient. Please refer to After Visit Summary for other counseling recommendations.  Return in about 2 weeks (around 02/05/2018) for  ROB.   Brock Bad, MD

## 2018-02-03 ENCOUNTER — Encounter (HOSPITAL_COMMUNITY): Payer: Self-pay

## 2018-02-03 ENCOUNTER — Inpatient Hospital Stay (HOSPITAL_COMMUNITY)
Admission: AD | Admit: 2018-02-03 | Discharge: 2018-02-03 | Disposition: A | Discharge status: Home / Self Care | Payer: Medicaid Other | Attending: Obstetrics and Gynecology | Admitting: Obstetrics and Gynecology

## 2018-02-03 DIAGNOSIS — O26893 Other specified pregnancy related conditions, third trimester: Secondary | ICD-10-CM | POA: Diagnosis not present

## 2018-02-03 DIAGNOSIS — M7918 Myalgia, other site: Secondary | ICD-10-CM | POA: Diagnosis not present

## 2018-02-03 DIAGNOSIS — Z3A36 36 weeks gestation of pregnancy: Secondary | ICD-10-CM | POA: Diagnosis not present

## 2018-02-03 DIAGNOSIS — R8271 Bacteriuria: Secondary | ICD-10-CM

## 2018-02-03 DIAGNOSIS — R109 Unspecified abdominal pain: Secondary | ICD-10-CM | POA: Insufficient documentation

## 2018-02-03 DIAGNOSIS — Z3689 Encounter for other specified antenatal screening: Secondary | ICD-10-CM

## 2018-02-03 DIAGNOSIS — Z3480 Encounter for supervision of other normal pregnancy, unspecified trimester: Secondary | ICD-10-CM

## 2018-02-03 DIAGNOSIS — O26899 Other specified pregnancy related conditions, unspecified trimester: Secondary | ICD-10-CM

## 2018-02-03 LAB — URINALYSIS, ROUTINE W REFLEX MICROSCOPIC
BILIRUBIN URINE: NEGATIVE
Glucose, UA: NEGATIVE mg/dL
Hgb urine dipstick: NEGATIVE
Ketones, ur: NEGATIVE mg/dL
Nitrite: NEGATIVE
PH: 7 (ref 5.0–8.0)
Protein, ur: NEGATIVE mg/dL
Specific Gravity, Urine: 1.024 (ref 1.005–1.030)

## 2018-02-03 LAB — CBC
HCT: 29.1 % — ABNORMAL LOW (ref 36.0–46.0)
Hemoglobin: 9.1 g/dL — ABNORMAL LOW (ref 12.0–15.0)
MCH: 25.4 pg — ABNORMAL LOW (ref 26.0–34.0)
MCHC: 31.3 g/dL (ref 30.0–36.0)
MCV: 81.3 fL (ref 80.0–100.0)
NRBC: 0 % (ref 0.0–0.2)
PLATELETS: 285 10*3/uL (ref 150–400)
RBC: 3.58 MIL/uL — ABNORMAL LOW (ref 3.87–5.11)
RDW: 14.2 % (ref 11.5–15.5)
WBC: 7.1 10*3/uL (ref 4.0–10.5)

## 2018-02-03 LAB — OB RESULTS CONSOLE GBS: GBS: POSITIVE

## 2018-02-03 MED ORDER — CYCLOBENZAPRINE HCL 10 MG PO TABS
10.0000 mg | ORAL_TABLET | Freq: Once | ORAL | Status: AC
  Administered 2018-02-03: 10 mg via ORAL
  Filled 2018-02-03: qty 1

## 2018-02-03 MED ORDER — CYCLOBENZAPRINE HCL 10 MG PO TABS: 10.0000 mg | ORAL_TABLET | Freq: Two times a day (BID) | ORAL | 0 refills | Status: DC | PRN

## 2018-02-03 NOTE — Discharge Instructions (Signed)

## 2018-02-03 NOTE — MAU Note (Signed)
Pt having right sided abdominal pain and back pain x 3 days. Tried to call office, but no answer. No bleeding or LOF, +FM

## 2018-02-03 NOTE — MAU Provider Note (Addendum)
History     CSN: [id number]  Arrival date and time: 02/03/18 1405   First Provider Initiated Contact with Patient 02/03/18 1439      Chief Complaint  Patient presents with  . Abdominal Pain  . Back Pain   HPI Kristi Novak is a 23 y.o. G2P1001 at 6958w3d who presents to MAU with chief complaint of abdominal and back pain for the past three days. These are new problems. She endorses right-sided abdominal pain which radiates to her right lower back. She is unable to provide pain rating but denies aggravating or alleviating factors. She has not taken medication or tried other treatments for this problem. She denies vaginal bleeding, leaking of fluid, decreased fetal movement, fever, falls, or recent illness.    OB History    Gravida  2   Para  1   Term  1   Preterm      AB      Living  1     SAB      TAB      Ectopic      Multiple      Live Births  1           Past Medical History:  Diagnosis Date  . H/O chlamydia infection   . H/O trichomoniasis   . Hearing loss   . 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 Hypertropia RIGHT EYE    Past Surgical History:  Procedure Laterality Date  . [facility]   LEFT EYE  AND  1999 BILATERAL EYES 1999  . EYE SURGERY    . MEDIAN RECTUS REPAIR  08/21/2011   Procedure: MEDIAN RECTUS REPAIR;  Surgeon: Corinda GublerMichael A Spencer, MD;  [facility];  Service: Ophthalmology;  Laterality: Right;  inferior oblique myectomy  . TYMPANOSTOMY TUBE PLACEMENT  AGE 70   AND ADENOIDECTOMY  . WISDOM TOOTH EXTRACTION     [facility] OFFICE    Family History  Problem Relation Age of Onset  . Cancer Maternal Grandmother        breast  . Hypertension Other   . Healthy Mother   . Healthy Father     Social History   Tobacco Use  . Smoking status: Never Smoker  . Smokeless tobacco: Never Used  Substance Use Topics  . Alcohol use: No  . Drug use: No    Allergies: No Known  Allergies  Medications Prior to Admission  Medication Sig Dispense Refill Last Dose  . Elastic Bandages & Supports (COMFORT FIT MATERNITY SUPP LG) MISC 1 Units by Does not apply route daily. (Patient not taking: Reported on 01/22/2018) 1 each 0 Not Taking  . Prenatal Vit-Fe Fumarate-FA (PRENATAL MULTIVITAMIN) TABS tablet Take 1 tablet by mouth daily at 12 noon.   Taking    Review of Systems  Constitutional: Negative for chills, fatigue and fever.  Respiratory: Negative for shortness of breath.   Gastrointestinal: Positive for abdominal pain. Negative for nausea and vomiting.  Genitourinary: Negative for difficulty urinating, dyspareunia and flank pain.  Musculoskeletal: Positive for back pain.  Neurological: Negative for headaches.  All other systems reviewed and are negative.  Physical Exam   Blood pressure 120/62, pulse 88, temperature 98.5 F (36.9 C), temperature source Oral, resp. rate 16, weight 103.9 kg, last menstrual period 05/07/2017, SpO2 100 %.  Physical Exam  Nursing note and vitals reviewed. Constitutional: She is oriented to person, place, and time. She  appears well-developed and well-nourished.  Cardiovascular: Normal rate.  Respiratory: Effort normal.  GI: She exhibits no distension. There is no abdominal tenderness. There is no rebound, no guarding and no CVA tenderness.  Gravid  Genitourinary:    Vagina and uterus normal.     No vaginal discharge.     Genitourinary Comments: Cervix is closed/thick/posterior   Neurological: She is alert and oriented to person, place, and time.  Skin: Skin is warm and dry.  Psychiatric: She has a normal mood and affect. Her behavior is normal. Judgment and thought content normal.   Small simple hemorrhoid visualized during exam  MAU Course/MDM   --Reactive tracing: baseline 140, moderate variability, positive accelerations, no decelerations --Toco: occasional contractions, palpate mild, not felt by patient --Cervix  closed --Pain resolved with Flexeril given in MAU  . Patient Vitals for the past 24 hrs:  BP Temp Temp src Pulse Resp SpO2 Weight  02/03/18 1544 - - - - 16 - -  02/03/18 1433 120/62 98.5 F (36.9 C) Oral 88 16 100 % 103.9 kg    Results for orders placed or performed during the hospital encounter of 02/03/18 (from the past 24 hour(s))  Urinalysis, Routine w reflex microscopic     Status: Abnormal   Collection Time: 02/03/18  2:40 PM  Result Value Ref Range   Color, Urine YELLOW YELLOW   APPearance CLOUDY (A) CLEAR   Specific Gravity, Urine 1.024 1.005 - 1.030   pH 7.0 5.0 - 8.0   Glucose, UA NEGATIVE NEGATIVE mg/dL   Hgb urine dipstick NEGATIVE NEGATIVE   Bilirubin Urine NEGATIVE NEGATIVE   Ketones, ur NEGATIVE NEGATIVE mg/dL   Protein, ur NEGATIVE NEGATIVE mg/dL   Nitrite NEGATIVE NEGATIVE   Leukocytes, UA MODERATE (A) NEGATIVE   RBC / HPF 0-5 0 - 5 RBC/hpf   WBC, UA 6-10 0 - 5 WBC/hpf   Bacteria, UA RARE (A) NONE SEEN   Squamous Epithelial / LPF 0-5 0 - 5   Mucus PRESENT   CBC     Status: Abnormal   Collection Time: 02/03/18  3:12 PM  Result Value Ref Range   WBC 7.1 4.0 - 10.5 K/uL   RBC 3.58 (L) 3.87 - 5.11 MIL/uL   Hemoglobin 9.1 (L) 12.0 - 15.0 g/dL   HCT 09.3 (L) 26.7 - 12.4 %   MCV 81.3 80.0 - 100.0 fL   MCH 25.4 (L) 26.0 - 34.0 pg   MCHC 31.3 30.0 - 36.0 g/dL   RDW 58.0 99.8 - 33.8 %   Platelets 285 150 - 400 K/uL   nRBC 0.0 0.0 - 0.2 %    Meds ordered this encounter  Medications  . cyclobenzaprine (FLEXERIL) tablet 10 mg  . cyclobenzaprine (FLEXERIL) 10 MG tablet    Sig: Take 1 tablet (10 mg total) by mouth 2 (two) times daily as needed for muscle spasms.    Dispense:  20 tablet    Refill:  0    Order Specific Question:   Supervising Provider    Answer:   Reva Bores [[id number]]    Assessment and Plan  --23 y.o. G2P1001 at [redacted]w[redacted]d  --Musculoskeletal pain exacerbated by fetal positioning --Reactive tracing --Patient denies pain at time of  discharge --Rx Flexeril to patient pharmacy --Discharge home in stable condition  F/U: [facility] 02/05/2018  Calvert Cantor, CNM 02/03/2018, 4:38 PM

## 2018-02-05 ENCOUNTER — Encounter: Payer: Medicaid Other | Admitting: Obstetrics

## 2018-02-05 ENCOUNTER — Telehealth: Payer: Self-pay

## 2018-02-05 ENCOUNTER — Other Ambulatory Visit: Payer: Self-pay | Admitting: Advanced Practice Midwife

## 2018-02-05 LAB — CULTURE, OB URINE

## 2018-02-05 MED ORDER — AMOXICILLIN 500 MG PO CAPS: 500.0000 mg | ORAL_CAPSULE | Freq: Three times a day (TID) | ORAL | 0 refills | Status: DC

## 2018-02-05 NOTE — Telephone Encounter (Signed)
Lab called to advise of Positive Group B, sent to provider for review.

## 2018-02-05 NOTE — Progress Notes (Signed)
GBS + in urine culture. Patient notified of results and prescription via active MyChart account

## 2018-02-11 ENCOUNTER — Other Ambulatory Visit (HOSPITAL_COMMUNITY)
Admission: RE | Admit: 2018-02-11 | Discharge: 2018-02-11 | Disposition: A | Discharge status: Home / Self Care | Payer: Medicaid Other | Source: Ambulatory Visit | Attending: Obstetrics | Admitting: Obstetrics

## 2018-02-11 ENCOUNTER — Encounter: Payer: Self-pay | Admitting: Obstetrics

## 2018-02-11 ENCOUNTER — Ambulatory Visit (INDEPENDENT_AMBULATORY_CARE_PROVIDER_SITE_OTHER): Payer: Medicaid Other | Admitting: Obstetrics

## 2018-02-11 VITALS — BP 111/68 | HR 77 | Wt 230.9 lb

## 2018-02-11 DIAGNOSIS — Z3483 Encounter for supervision of other normal pregnancy, third trimester: Secondary | ICD-10-CM

## 2018-02-11 DIAGNOSIS — N898 Other specified noninflammatory disorders of vagina: Secondary | ICD-10-CM | POA: Insufficient documentation

## 2018-02-11 DIAGNOSIS — Z3480 Encounter for supervision of other normal pregnancy, unspecified trimester: Secondary | ICD-10-CM

## 2018-02-11 DIAGNOSIS — R8271 Bacteriuria: Secondary | ICD-10-CM

## 2018-02-11 NOTE — Addendum Note (Signed)
Addended by: Maretta Bees on: 02/11/2018 04:17 PM   Modules accepted: Orders

## 2018-02-11 NOTE — Progress Notes (Signed)
ROB.  GBS+ on urine.

## 2018-02-11 NOTE — Progress Notes (Signed)
Subjective:  Kristi Novak is a 23 y.o. G2P1001 at [redacted]w[redacted]d being seen today for ongoing prenatal care.  She is currently monitored for the following issues for this low-risk pregnancy and has Hypertropia; Encounter for supervision of normal pregnancy, unspecified, unspecified trimester; Acid reflux; H/O chlamydia infection; H/O trichomoniasis; and GBS bacteriuria on their problem list.  Patient reports no complaints.  Contractions: Irritability. Vag. Bleeding: None.  Movement: Present. Denies leaking of fluid.   The following portions of the patient's history were reviewed and updated as appropriate: allergies, current medications, past family history, past medical history, past social history, past surgical history and problem list. Problem list updated.  Objective:   Vitals:   02/11/18 1512  BP: 111/68  Pulse: 77  Weight: 230 lb 14.4 oz (104.7 kg)    Fetal Status: Fetal Heart Rate (bpm): 140   Movement: Present     General:  Alert, oriented and cooperative. Patient is in no acute distress.  Skin: Skin is warm and dry. No rash noted.   Cardiovascular: Normal heart rate noted  Respiratory: Normal respiratory effort, no problems with respiration noted  Abdomen: Soft, gravid, appropriate for gestational age. Pain/Pressure: Present     Pelvic:  Cervical exam deferred        Extremities: Normal range of motion.  Edema: Trace  Mental Status: Normal mood and affect. Normal behavior. Normal judgment and thought content.   Urinalysis:      Assessment and Plan:  Pregnancy: G2P1001 at [redacted]w[redacted]d  1. Supervision of other normal pregnancy, antepartum  2. GBS bacteriuria  3. Vaginal discharge Rx: - Cervicovaginal ancillary only( [place])  Term labor symptoms and general obstetric precautions including but not limited to vaginal bleeding, contractions, leaking of fluid and fetal movement were reviewed in detail with the patient. Please refer to After Visit Summary for other counseling  recommendations.  No follow-ups on file.   Brock Bad, MD

## 2018-02-12 ENCOUNTER — Inpatient Hospital Stay (HOSPITAL_COMMUNITY): Payer: Medicaid Other | Admitting: Anesthesiology

## 2018-02-12 ENCOUNTER — Inpatient Hospital Stay (HOSPITAL_COMMUNITY)
Admission: AD | Admit: 2018-02-12 | Discharge: 2018-02-14 | DRG: 807 | Disposition: A | Discharge status: Home / Self Care | Payer: Medicaid Other | Attending: Family Medicine | Admitting: Family Medicine

## 2018-02-12 ENCOUNTER — Encounter (HOSPITAL_COMMUNITY): Payer: Self-pay | Admitting: *Deleted

## 2018-02-12 DIAGNOSIS — R8271 Bacteriuria: Secondary | ICD-10-CM | POA: Diagnosis present

## 2018-02-12 DIAGNOSIS — O9902 Anemia complicating childbirth: Secondary | ICD-10-CM | POA: Diagnosis present

## 2018-02-12 DIAGNOSIS — Z3483 Encounter for supervision of other normal pregnancy, third trimester: Secondary | ICD-10-CM | POA: Diagnosis present

## 2018-02-12 DIAGNOSIS — O99824 Streptococcus B carrier state complicating childbirth: Principal | ICD-10-CM | POA: Diagnosis present

## 2018-02-12 DIAGNOSIS — Z3A37 37 weeks gestation of pregnancy: Secondary | ICD-10-CM

## 2018-02-12 DIAGNOSIS — D649 Anemia, unspecified: Secondary | ICD-10-CM | POA: Diagnosis present

## 2018-02-12 LAB — CBC
HCT: 31.5 % — ABNORMAL LOW (ref 36.0–46.0)
Hemoglobin: 9.7 g/dL — ABNORMAL LOW (ref 12.0–15.0)
MCH: 24.8 pg — AB (ref 26.0–34.0)
MCHC: 30.8 g/dL (ref 30.0–36.0)
MCV: 80.6 fL (ref 80.0–100.0)
Platelets: 290 10*3/uL (ref 150–400)
RBC: 3.91 MIL/uL (ref 3.87–5.11)
RDW: 14.5 % (ref 11.5–15.5)
WBC: 6.9 10*3/uL (ref 4.0–10.5)
nRBC: 0 % (ref 0.0–0.2)

## 2018-02-12 LAB — TYPE AND SCREEN
ABO/RH(D): A POS
Antibody Screen: NEGATIVE

## 2018-02-12 MED ORDER — LACTATED RINGERS IV SOLN: 500.0000 mL | Freq: Once | INTRAVENOUS | Status: AC

## 2018-02-12 MED ORDER — EPHEDRINE 5 MG/ML INJ
10.0000 mg | INTRAVENOUS | Status: DC | PRN
  Filled 2018-02-12: qty 2

## 2018-02-12 MED ORDER — PENICILLIN G 3 MILLION UNITS IVPB - SIMPLE MED
3.0000 10*6.[IU] | INTRAVENOUS | Status: DC
  Administered 2018-02-12: 3 10*6.[IU] via INTRAVENOUS
  Filled 2018-02-12 (×4): qty 100

## 2018-02-12 MED ORDER — SOD CITRATE-CITRIC ACID 500-334 MG/5ML PO SOLN: 30.0000 mL | ORAL | Status: DC | PRN

## 2018-02-12 MED ORDER — LACTATED RINGERS IV SOLN
INTRAVENOUS | Status: DC
  Administered 2018-02-12 (×2): via INTRAVENOUS

## 2018-02-12 MED ORDER — PHENYLEPHRINE 40 MCG/ML (10ML) SYRINGE FOR IV PUSH (FOR BLOOD PRESSURE SUPPORT)
80.0000 ug | PREFILLED_SYRINGE | INTRAVENOUS | Status: DC | PRN
  Filled 2018-02-12: qty 10

## 2018-02-12 MED ORDER — PHENYLEPHRINE 40 MCG/ML (10ML) SYRINGE FOR IV PUSH (FOR BLOOD PRESSURE SUPPORT)
80.0000 ug | PREFILLED_SYRINGE | INTRAVENOUS | Status: DC | PRN
  Filled 2018-02-12 (×2): qty 10

## 2018-02-12 MED ORDER — FENTANYL CITRATE (PF) 100 MCG/2ML IJ SOLN: 100.0000 ug | INTRAMUSCULAR | Status: DC | PRN

## 2018-02-12 MED ORDER — LIDOCAINE HCL (PF) 1 % IJ SOLN
INTRAMUSCULAR | Status: DC | PRN
  Administered 2018-02-12: 5 mL via EPIDURAL

## 2018-02-12 MED ORDER — PENICILLIN G 3 MILLION UNITS IVPB - SIMPLE MED: 3.0000 10*6.[IU] | INTRAVENOUS | Status: DC

## 2018-02-12 MED ORDER — LACTATED RINGERS IV SOLN: 500.0000 mL | INTRAVENOUS | Status: DC | PRN

## 2018-02-12 MED ORDER — PENICILLIN G 3 MILLION UNITS IVPB - SIMPLE MED: 3.0000 10*6.[IU] | Freq: Once | INTRAVENOUS | Status: DC

## 2018-02-12 MED ORDER — LIDOCAINE HCL (PF) 1 % IJ SOLN
30.0000 mL | INTRAMUSCULAR | Status: DC | PRN
  Filled 2018-02-12: qty 30

## 2018-02-12 MED ORDER — OXYTOCIN 40 UNITS IN NORMAL SALINE INFUSION - SIMPLE MED
2.5000 [IU]/h | INTRAVENOUS | Status: DC
  Administered 2018-02-12: 2.5 [IU]/h via INTRAVENOUS
  Filled 2018-02-12: qty 1000

## 2018-02-12 MED ORDER — OXYTOCIN BOLUS FROM INFUSION
500.0000 mL | Freq: Once | INTRAVENOUS | Status: AC
  Administered 2018-02-12: 500 mL via INTRAVENOUS

## 2018-02-12 MED ORDER — ACETAMINOPHEN 325 MG PO TABS: 650.0000 mg | ORAL_TABLET | ORAL | Status: DC | PRN

## 2018-02-12 MED ORDER — LACTATED RINGERS IV SOLN
500.0000 mL | Freq: Once | INTRAVENOUS | Status: AC
  Administered 2018-02-12: 1000 mL via INTRAVENOUS

## 2018-02-12 MED ORDER — ONDANSETRON HCL 4 MG/2ML IJ SOLN: 4.0000 mg | Freq: Four times a day (QID) | INTRAMUSCULAR | Status: DC | PRN

## 2018-02-12 MED ORDER — DIPHENHYDRAMINE HCL 50 MG/ML IJ SOLN: 12.5000 mg | INTRAMUSCULAR | Status: DC | PRN

## 2018-02-12 MED ORDER — FENTANYL 2.5 MCG/ML BUPIVACAINE 1/10 % EPIDURAL INFUSION (WH - ANES)
14.0000 mL/h | INTRAMUSCULAR | Status: DC | PRN
  Administered 2018-02-12 (×2): 14 mL/h via EPIDURAL
  Filled 2018-02-12 (×2): qty 100

## 2018-02-12 MED ORDER — SODIUM CHLORIDE 0.9 % IV SOLN
2.0000 g | Freq: Once | INTRAVENOUS | Status: AC
  Administered 2018-02-12: 2 g via INTRAVENOUS
  Filled 2018-02-12: qty 2

## 2018-02-12 NOTE — Progress Notes (Signed)
   Kristi Novak is a 23 y.o. G2P1001 at [redacted]w[redacted]d  admitted for active labor  Subjective: Coping well with epidural  Objective: Vitals:   02/12/18 1400 02/12/18 1430 02/12/18 1500 02/12/18 1530  BP: 101/62 (!) 92/27 (!) 101/48 (!) 108/55  Pulse: 74 83 73 68  Resp: 18 20 16    Temp:      TempSrc:      SpO2:      Weight:      Height:       Total I/O In: -  Out: 150 [Urine:150]  FHT:  FHR: 125 bpm, variability: moderate,  accelerations:  Present,  decelerations:  Absent UC:   irregular, every 2 minutes SVE:   Dilation: 8 Effacement (%): 100 Station: -1, 0 Exam by:: Luna Kitchens, CNM  None Labs: Lab Results  Component Value Date   WBC 6.9 02/12/2018   HGB 9.7 (L) 02/12/2018   HCT 31.5 (L) 02/12/2018   MCV 80.6 02/12/2018   PLT 290 02/12/2018    Assessment / Plan: Spontaneous labor, progressing normally Now with bulging bag; will rupture at 6 PM.   Labor: Progressing normally Fetal Wellbeing:  Category I Pain Control:  Epidural Anticipated MOD:  NSVD  Kristi Novak Kristi Novak 02/12/2018, 4:33 PM

## 2018-02-12 NOTE — MAU Note (Signed)
Pt reports contractions since 6 am, denies bleeding or ROM.

## 2018-02-12 NOTE — Progress Notes (Addendum)
LABOR PROGRESS NOTE  Kristi Novak is a 23 y.o. G2P1001 at [redacted]w[redacted]d admitted for cramping and SOL.  Subjective: Patient is seen resting in bed, relaxing. Her mother and grandmother are at bedside. She reports fetal movement and says she felt some sort of leakage of vaginal fluids. She denies bleeding.   Objective: BP (!) 119/51   Pulse 89   Temp 98 F (36.7 C) (Oral)   Resp 18   Ht 5\' 4"  (1.626 m)   Wt 104.8 kg   LMP 05/07/2017   SpO2 100%   BMI 39.65 kg/m  or  Vitals:   02/12/18 1700 02/12/18 1730 02/12/18 1800 02/12/18 1817  BP: (!) 109/57 (!) 112/56 (!) 119/51   Pulse: 74 73 89   Resp: 16 20 18 18   Temp:    98 F (36.7 C)  TempSrc:    Oral  SpO2:      Weight:      Height:       Dilation: 10 Dilation Complete Date: 02/12/18 Dilation Complete Time: 1850 Effacement (%): 100 Cervical Position: Anterior Station: 0, Plus 1 Presentation: Vertex Exam by:: Peggyann Shoals DO  FHT: baseline rate 125, moderate varibility, 15x15 accel, no decel Toco: Contractions q4-5 minutes  Labs: Lab Results  Component Value Date   WBC 6.9 02/12/2018   HGB 9.7 (L) 02/12/2018   HCT 31.5 (L) 02/12/2018   MCV 80.6 02/12/2018   PLT 290 02/12/2018   Patient Active Problem List   Diagnosis Date Noted  . Normal labor 02/12/2018  . GBS bacteriuria 09/18/2017  . Encounter for supervision of normal pregnancy, unspecified, unspecified trimester 07/24/2017  . Acid reflux 07/24/2017  . H/O chlamydia infection   . H/O trichomoniasis   . Hypertropia 08/20/2011   Assessment / Plan: 23 y.o. G2P1001 at [redacted]w[redacted]d here for cramping and SOL.  Labor: Active, patient AROMed at 1849, clear fluid Fetal Wellbeing:  Category 1 Pain Control:  Epidural Anticipated MOD:  SVD GBS positive status: patient received Ampicillin 2g for 4 hours (2pm - 6pm on 02/06), then switched to PCN (to keep costs low)   Peggyann Shoals, DO [facility], PGY-1 02/12/2018 7:11 PM

## 2018-02-12 NOTE — Anesthesia Procedure Notes (Signed)
Epidural Patient location during procedure: OB Start time: 02/12/2018 11:15 AM End time: 02/12/2018 11:26 AM  Staffing Anesthesiologist: Achille Rich, MD Performed: anesthesiologist   Preanesthetic Checklist Completed: patient identified, site marked, pre-op evaluation, timeout performed, IV checked, risks and benefits discussed and monitors and equipment checked  Epidural Patient position: sitting Prep: DuraPrep Patient monitoring: heart rate, cardiac monitor, continuous pulse ox and blood pressure Approach: midline Location: L2-L3 Injection technique: LOR saline  Needle:  Needle type: Tuohy  Needle gauge: 17 G Needle length: 9 cm Needle insertion depth: 5 cm Catheter type: closed end flexible Catheter size: 19 Gauge Catheter at skin depth: 11 cm Test dose: negative and Other  Assessment Events: blood not aspirated, injection not painful, no injection resistance and negative IV test  Additional Notes Informed consent obtained prior to proceeding including risk of failure, 1% risk of PDPH, risk of minor discomfort and bruising.  Discussed rare but serious complications including epidural abscess, permanent nerve injury, epidural hematoma.  Discussed alternatives to epidural analgesia and patient desires to proceed.  Timeout performed pre-procedure verifying patient name, procedure, and platelet count.  Patient tolerated procedure well. Reason for block:procedure for pain

## 2018-02-12 NOTE — Anesthesia Preprocedure Evaluation (Signed)
Anesthesia Evaluation  Patient identified by MRN, date of birth, ID band Patient awake    Reviewed: Allergy & Precautions, H&P , NPO status , Patient's Chart, lab work & pertinent test results  Airway Mallampati: II   Neck ROM: full    Dental   Pulmonary neg pulmonary ROS,    breath sounds clear to auscultation       Cardiovascular negative cardio ROS   Rhythm:regular Rate:Normal     Neuro/Psych    GI/Hepatic GERD  ,  Endo/Other  obese  Renal/GU      Musculoskeletal   Abdominal   Peds  Hematology   Anesthesia Other Findings   Reproductive/Obstetrics (+) Pregnancy                             Anesthesia Physical Anesthesia Plan  ASA: II  Anesthesia Plan: Epidural   Post-op Pain Management:    Induction: Intravenous  PONV Risk Score and Plan: 2 and Treatment may vary due to age or medical condition  Airway Management Planned: Natural Airway  Additional Equipment:   Intra-op Plan:   Post-operative Plan:   Informed Consent: I have reviewed the patients History and Physical, chart, labs and discussed the procedure including the risks, benefits and alternatives for the proposed anesthesia with the patient or authorized representative who has indicated his/her understanding and acceptance.       Plan Discussed with: Anesthesiologist  Anesthesia Plan Comments:         Anesthesia Quick Evaluation

## 2018-02-12 NOTE — H&P (Addendum)
LABOR AND DELIVERY ADMISSION HISTORY AND PHYSICAL NOTE  Rondel OhBrianna N Mcfayden is a 23 y.o. female G2P1001 with IUP at 562w5d by 1st-Trimester US presenting for cramping SOL.   UA with culture 01/282020 /grew 60K colonies GBS - patient prescribed Amoxicillin but did not receive it.  She reports positive fetal movement. She denies leakage of fluid or vaginal bleeding.  [facility]/Com[facility] Pregnancy complications:  - UTI on 02/03/2018, untreated - Anemia: Hgb on admission 9.7  Past Medical History: Past Medical History:  Diagnosis Date  . H/O chlamydia infection   . H/O trichomoniasis   . Hearing loss   . 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 [facility]YE SURG[facility]  [facility]   LEFT EYE  AND  1999 BILATERAL EYES 1999  . EYE SURGERY    . MEDIAN RECTUS REPAIR  08/21/2011   Procedure: MEDIAN RECTUS REPAIR;  Surgeon: Corinda GublerMichael A Spencer, MD;  [facility] [place];  Service: Ophthalmology;  Laterality: Right;  inferior oblique myectomy  . TYMPANOSTOMY TUBE PLACEMENT  AGE 31   AND ADENOIDECTOMY  . WISDOM TOOTH EXTRACTION     ORAL SURGEON OFFICE   Obstetrical History: OB History    Gravida  2   Para  1   Term  1   Preterm      AB      Living  1     SAB      TAB      Ectopic      Multiple      Live Births  1          Social History: Social History   Socioeconomic History  . Marital status: Single    Spouse name: Not on file  . Number of children: 1  . Years of education: Not on file  . Highest education level: Not on file  Occupational History  . Occupation: Consulting civil engineerstudent  Social Needs  . Financial resource strain: Not on file  . Food insecurity:    Worry: Not on file    Inability: Not on file  . Transportation needs:    Medical:  Not on file    Non-medical: Not on file  Tobacco Use  . Smoking status: Never Smoker  . Smokeless tobacco: Never Used  Substance and Sexual Activity  . Alcohol use: No  . Drug use: No  . Sexual activity: Yes    Partners: Male    Birth control/protection: None  Lifestyle  . Physical activity:    Days per week: Not on file    Minutes per session: Not on file  . Stress: Not on file  Relationships  . Social connections:    Talks on phone: Not on file    Gets together: Not on file    Attends religious service: Not on file    Active member of club or organization: Not on file    Attends meetings of clubs or organizations: Not on file    Relationship status: Not on file  Other Topics Concern  . Not on file  Social History Narrative   Lives with mother, grandmother and child.     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    Family History: Family History  Problem Relation Age of Onset  .  Cancer Maternal Grandmother        breast  . Hypertension Other   . Healthy Mother   . Healthy Father    Allergies: No Known Allergies  Medications Prior to Admission  Medication Sig Dispense Refill Last Dose  . Prenatal Vit-Fe Fumarate-FA (PRENATAL MULTIVITAMIN) TABS tablet Take 1 tablet by mouth daily at 12 noon.   Past Week at Unknown time  . amoxicillin (AMOXIL) 500 MG capsule Take 1 capsule (500 mg total) by mouth 3 (three) times daily for 10 days. 30 capsule 0   . cyclobenzaprine (FLEXERIL) 10 MG tablet Take 1 tablet (10 mg total) by mouth 2 (two) times daily as needed for muscle spasms. 20 tablet 0   . Elastic Bandages & Supports (COMFORT FIT MATERNITY SUPP LG) MISC 1 Units by Does not apply route daily. (Patient not taking: Reported on 01/22/2018) 1 each 0 Not Taking    Review of Systems  All systems reviewed and negative except as stated in HPI  Physical Exam Blood pressure 125/69, pulse 69, temperature 97.9 F (36.6 C), temperature source Oral, resp. rate  20, height 5\' 4"  (1.626 m), weight 104.8 kg, last menstrual period 05/07/2017, SpO2 100 %. General appearance: alert, oriented, NAD Lungs: normal respiratory effort Heart: regular rate Abdomen: soft, non-tender; gravid, FH appropriate for GA Extremities: No calf swelling or tenderness Presentation: cephalic Fetal monitoring: baseline 125, moderate variability, accels 10x10, no decels Uterine activity: Contractions q4-6 minutes Dilation: 4 Effacement (%): 90 Station: -2 Exam by:: Duncan Dull  Prenatal labs: ABO, Rh: A/Positive/-- (07/18 1457) Antibody: Negative (07/18 1457) Rubella: 4.46 (07/18 1457) RPR: Non Reactive (12/04 1037)  HBsAg: Negative (07/18 1457)  HIV: Non Reactive (12/04 1037)  GC/Chlamydia: negative/negative (10/2017) GBS:   PENDING; has had GBS in her urine in pregnancy 2-hr GTT: Normal in 3rd trimester Genetic screening:  Declined Anatomy US: Normal, Girl  Prenatal Transfer Tool  Maternal Diabetes: No Genetic Screening: Declined Maternal Ultrasounds/Referrals: Normal Fetal Ultrasounds or other Referrals:  None Maternal Substance Abuse:  No Significant Maternal Medications:  None Significant Maternal Lab Results: None  Results for orders placed or performed during the hospital encounter of 02/12/18 (from the past 24 hour(s))  CBC   Collection Time: 02/12/18 10:12 AM  Result Value Ref Range   WBC 6.9 4.0 - 10.5 K/uL   RBC 3.91 3.87 - 5.11 MIL/uL   Hemoglobin 9.7 (L) 12.0 - 15.0 g/dL   HCT 27.5 (L) 17.0 - 01.7 %   MCV 80.6 80.0 - 100.0 fL   MCH 24.8 (L) 26.0 - 34.0 pg   MCHC 30.8 30.0 - 36.0 g/dL   RDW 49.4 49.6 - 75.9 %   Platelets 290 150 - 400 K/uL   nRBC 0.0 0.0 - 0.2 %   Patient Active Problem List   Diagnosis Date Noted  . Normal labor 02/12/2018  . GBS bacteriuria 09/18/2017  . Encounter for supervision of normal pregnancy, unspecified, unspecified trimester 07/24/2017  . Acid reflux 07/24/2017  . H/O chlamydia infection   . H/O  trichomoniasis   . Hypertropia 08/20/2011   Assessment: ULDENE SERFASS is a 23 y.o. G2P1001 at [redacted]w[redacted]d here for SOL.  #Labor: Latent, no augmentation #Pain: Epidural #FWB: Category 1 #ID:  GBS in urine; will treat as GBS positive #MOF: Breast #MOC:Nexplanon #Circ:  No, girl  Dollene Cleveland 02/12/2018, 11:54 AM   CNM attestation:  I have seen and examined this patient and agree with above   documentation in the resident's note.  LONDEN KONKEL is a 23 y.o. G2P1001 at [redacted]w[redacted]d reporting contractions since 6 am this morning. She denies LOF, VB, endorses positive fetal movements.   PE: Patient Vitals for the past 24 hrs:  BP Temp Temp src Pulse Resp SpO2 Height Weight  02/12/18 1205 - - - - - 100 % - -  02/12/18 1200 115/64 98.2 F (36.8 C) Oral 68 18 100 % - -  02/12/18 1155 122/73 - - 71 18 100 % - -  02/12/18 1150 125/69 - - 69 20 100 % - -  02/12/18 1145 95/77 - - 83 18 100 % - -  02/12/18 1140 119/73 - - 80 18 100 % - -  02/12/18 1134 119/61 - - 63 16 - - -  02/12/18 1131 111/90 - - 74 18 - - -  02/12/18 1129 121/78 - - 79 20 - - -  02/12/18 1127 126/64 - - 65 18 - - -  02/12/18 1040 - - - - - - 5\' 4"  (1.626 m) 104.8 kg  02/12/18 1023 133/77 97.9 F (36.6 C) Oral 80 18 - - -   Gen: calm comfortable, NAD Resp: normal effort, no distress Heart: Regular rate Abd: Soft, NT, gravid, S=D  FHR: Baseline 115 , ,mod Variability, pos accels, no decels Toco: UC's irregular   ROS, labs, PMH reviewed   Assessment: 1. GBS bacteriuria     Plan: -Admit to birthing suites, epidural if desired. Start Amp and PCN.    Marylene Land, CNM 02/12/2018 3:04 PM

## 2018-02-12 NOTE — Anesthesia Pain Management Evaluation Note (Signed)
  CRNA Pain Management Visit Note  Patient: Kristi Novak, 23 y.o., female  "Hello I am a member of the anesthesia team at [facility]. We have an anesthesia team available at all times to provide care throughout the hospital, including epidural management and anesthesia for C-section. I don't know your plan for the delivery whether it a natural birth, water birth, IV sedation, nitrous supplementation, doula or epidural, but we want to meet your pain goals."   1.Was your pain managed to your expectations on prior hospitalizations?   No prior hospitalizations  2.What is your expectation for pain management during this hospitalization?     Epidural  3.How can we help you reach that goal?   Record the patient's initial score and the patient's pain goal.   Pain: 2  Pain Goal: 3 The [facility] wants you to be able to say your pain was always managed very well.  Laban Emperor 02/12/2018

## 2018-02-12 NOTE — MAU Note (Signed)
Urine sent to lab 

## 2018-02-12 NOTE — Discharge Summary (Addendum)
Postpartum Discharge Summary     Patient Name: Kristi Novak DOB: 17-Jun-1995 MRN: [id number]  Date of admission: 02/12/2018 Delivering Provider: Jacklyn Shell   Date of discharge: 02/14/2018  Admitting diagnosis: 38wks cramping Intrauterine pregnancy: [redacted]w[redacted]d     Secondary diagnosis:  Active Problems:   GBS bacteriuria   Normal labor  Additional problems: none     Discharge diagnosis: Term Pregnancy Delivered                                                                                                Post partum procedures:none  Augmentation: AROM  Complications: None  Hospital course:  Onset of Labor With Vaginal Delivery     23 y.o. yo G2P1001 at [redacted]w[redacted]d was admitted in Active Labor on 02/12/2018. Patient had an uncomplicated labor course as follows: 10 minute 2nd stage.  Membrane Rupture Time/Date: 6:49 PM ,02/12/2018   Intrapartum Procedures: Episiotomy: None [1]                                         Lacerations:  1st degree [2];Periurethral [8] several 1st degree periurethral, closed w/3-0 monocryl Patient had a delivery of a Viable infant. 02/12/2018  Information for the patient's newborn:  Cornella, Ruffer [[id number]]  Delivery Method: Vag-Spont    Pateint had an uncomplicated postpartum course.  She is ambulating, tolerating a regular diet, passing flatus, and urinating well. Patient is discharged home in stable condition on 02/14/18.   Magnesium Sulfate recieved: No BMZ received: No  Physical exam  Vitals:   02/13/18 0845 02/13/18 1245 02/13/18 1955 02/14/18 0547  BP: 107/74 112/72 132/86 125/75  Pulse: 66 65 61 (!) 57  Resp: 18 20 16 16   Temp: 98.3 F (36.8 C) 98 F (36.7 C) 98.1 F (36.7 C) 98.1 F (36.7 C)  TempSrc: Oral Oral Axillary Oral  SpO2:      Weight:      Height:       General: alert, cooperative and no distress Lochia: appropriate Uterine Fundus: firm Incision: N/A DVT Evaluation: No cords or calf tenderness. Calf/Ankle  edema is present Labs: Lab Results  Component Value Date   WBC 6.9 02/12/2018   HGB 9.7 (L) 02/12/2018   HCT 31.5 (L) 02/12/2018   MCV 80.6 02/12/2018   PLT 290 02/12/2018   CMP Latest Ref Rng & Units 01/17/2009  Glucose 70 - 99 mg/dL 90  BUN 6 - 23 mg/dL 14  Creatinine 0.4 - 1.2 mg/dL 8.01  Sodium 655 - 374 mEq/L 139  Potassium 3.5 - 5.1 mEq/L 4.6  Chloride 96 - 112 mEq/L 106  CO2 19 - 32 mEq/L 23  Calcium 8.4 - 10.5 mg/dL 9.5    Discharge instruction: per After Visit Summary and "Baby and Me Booklet".  After visit meds:  Allergies as of 02/14/2018   No Known Allergies     Medication List    STOP taking these medications   amoxicillin 500 MG capsule Commonly known as:  AMOXIL  TAKE these medications   acetaminophen 325 MG tablet Commonly known as:  TYLENOL Take 2 tablets (650 mg total) by mouth every 4 (four) hours as needed (for pain scale < 4).   COMFORT FIT MATERNITY SUPP LG Misc 1 Units by Does not apply route daily.   cyclobenzaprine 10 MG tablet Commonly known as:  FLEXERIL Take 1 tablet (10 mg total) by mouth 2 (two) times daily as needed for muscle spasms.   ibuprofen 600 MG tablet Commonly known as:  ADVIL,MOTRIN Take 1 tablet (600 mg total) by mouth every 6 (six) hours.   prenatal multivitamin Tabs tablet Take 1 tablet by mouth daily at 12 noon.   senna-docusate 8.6-50 MG tablet Commonly known as:  Senokot-S Take 2 tablets by mouth daily. Start taking on:  February 15, 2018       Diet: routine diet  Activity: Advance as tolerated. Pelvic rest for 6 weeks.   Outpatient follow up:4 weeks Follow up Appt: Future Appointments  Date Time Provider Department Center  03/12/2018  1:00 PM Sharyon Cable, CNM [facility] None   Follow up Visit:   Please schedule this patient for Postpartum visit in: 4 weeks with the following provider: Any provider For C/S patients schedule nurse incision check in weeks 2 weeks:  Low risk pregnancy complicated  by:  Delivery mode:  SVD Anticipated Birth Control:  Nexplanon PP Procedures needed:   Schedule Integrated BH visit: no      Newborn Data: Live born female  Birth Weight: 3099gm APGAR: ,8/9   Newborn Delivery   Birth date/time:  02/12/2018 21:26:00 Delivery type:  Vaginal, Spontaneous     Baby Feeding: Bottle and Breast Disposition:home with mother   02/14/2018 Arabella Merles, CNM  9:44 AM

## 2018-02-13 LAB — STREP GP B NAA: Strep Gp B NAA: POSITIVE — AB

## 2018-02-13 LAB — RPR: RPR: NONREACTIVE

## 2018-02-13 MED ORDER — SIMETHICONE 80 MG PO CHEW: 80.0000 mg | CHEWABLE_TABLET | ORAL | Status: DC | PRN

## 2018-02-13 MED ORDER — DIBUCAINE 1 % RE OINT: 1.0000 "application " | TOPICAL_OINTMENT | RECTAL | Status: DC | PRN

## 2018-02-13 MED ORDER — BENZOCAINE-MENTHOL 20-0.5 % EX AERO: 1.0000 "application " | INHALATION_SPRAY | CUTANEOUS | Status: DC | PRN

## 2018-02-13 MED ORDER — ONDANSETRON HCL 4 MG PO TABS: 4.0000 mg | ORAL_TABLET | ORAL | Status: DC | PRN

## 2018-02-13 MED ORDER — TETANUS-DIPHTH-ACELL PERTUSSIS 5-2.5-18.5 LF-MCG/0.5 IM SUSP: 0.5000 mL | Freq: Once | INTRAMUSCULAR | Status: DC

## 2018-02-13 MED ORDER — PRENATAL MULTIVITAMIN CH
1.0000 | ORAL_TABLET | Freq: Every day | ORAL | Status: DC
  Administered 2018-02-13 – 2018-02-14 (×2): 1 via ORAL
  Filled 2018-02-13 (×2): qty 1

## 2018-02-13 MED ORDER — COCONUT OIL OIL: 1.0000 "application " | TOPICAL_OIL | Status: DC | PRN

## 2018-02-13 MED ORDER — WITCH HAZEL-GLYCERIN EX PADS: 1.0000 "application " | MEDICATED_PAD | CUTANEOUS | Status: DC | PRN

## 2018-02-13 MED ORDER — IBUPROFEN 600 MG PO TABS
600.0000 mg | ORAL_TABLET | Freq: Four times a day (QID) | ORAL | Status: DC
  Administered 2018-02-13 – 2018-02-14 (×6): 600 mg via ORAL
  Filled 2018-02-13 (×6): qty 1

## 2018-02-13 MED ORDER — ONDANSETRON HCL 4 MG/2ML IJ SOLN: 4.0000 mg | INTRAMUSCULAR | Status: DC | PRN

## 2018-02-13 MED ORDER — SENNOSIDES-DOCUSATE SODIUM 8.6-50 MG PO TABS
2.0000 | ORAL_TABLET | ORAL | Status: DC
  Administered 2018-02-14: 2 via ORAL
  Filled 2018-02-13: qty 2

## 2018-02-13 MED ORDER — ACETAMINOPHEN 325 MG PO TABS: 650.0000 mg | ORAL_TABLET | ORAL | Status: DC | PRN

## 2018-02-13 MED ORDER — DIPHENHYDRAMINE HCL 25 MG PO CAPS: 25.0000 mg | ORAL_CAPSULE | Freq: Four times a day (QID) | ORAL | Status: DC | PRN

## 2018-02-13 NOTE — Anesthesia Postprocedure Evaluation (Signed)
Anesthesia Post Note  Patient: Kristi Novak  Procedure(s) Performed: AN AD HOC LABOR EPIDURAL     Patient location during evaluation: Mother Baby Anesthesia Type: Epidural Level of consciousness: awake and alert and oriented Pain management: satisfactory to patient Vital Signs Assessment: post-procedure vital signs reviewed and stable Respiratory status: respiratory function stable Cardiovascular status: stable Postop Assessment: no headache, no backache, epidural receding, patient able to bend at knees, no signs of nausea or vomiting and adequate PO intake Anesthetic complications: no    Last Vitals:  Vitals:   02/13/18 0845 02/13/18 1245  BP: 107/74 112/72  Pulse: 66 65  Resp: 18 20  Temp: 36.8 C 36.7 C  SpO2:      Last Pain:  Vitals:   02/13/18 1245  TempSrc: Oral  PainSc: 0-No pain   Pain Goal: Patients Stated Pain Goal: 7 (02/12/18 1025)                 Wrigley Plasencia

## 2018-02-13 NOTE — Progress Notes (Signed)
CSW met with MOB to discuss needs for supplies for infant, MOB accompanied by her mother. MOB granted CSW verbal permission to speak in front of her mother about anything. CSW introduced self and explained reason for consult. MOB reported no needs for infant, MOB reported she only asked staff what supplies she was able to take from the hospital. MOB reported she has everything needed for baby. MOB's mother reported that the only thing they need to get is pampers and that they will go to [employer] to get those. MOB denied any needs. CSW signing off, MOB denied need for supplies. Please re-consult if new needs arise.  Halsey Persaud, LCSWA Clinical Social Worker [facility] Cell#: [phone] 

## 2018-02-13 NOTE — Progress Notes (Signed)
Post Partum Day 1 Subjective: no complaints, up ad lib, voiding and tolerating PO, small lochia, plans to breastfeed, Nexplanon  Objective: Blood pressure 119/76, pulse 75, temperature 98.3 F (36.8 C), temperature source Oral, resp. rate 18, height 5\' 4"  (1.626 m), weight 104.8 kg, last menstrual period 05/07/2017, SpO2 100 %, unknown if currently breastfeeding.  Physical Exam:  General: alert, cooperative and no distress Lochia:normal flow Chest: CTAB Heart: RRR no m/r/g Abdomen: +BS, soft, nontender,  Uterine Fundus: firm DVT Evaluation: No evidence of DVT seen on physical exam. Extremities: 1+ edema  Recent Labs    02/12/18 1012  HGB 9.7*  HCT 31.5*    Assessment/Plan: Plan for discharge tomorrow   LOS: 1 day   Jacklyn Shell 02/13/2018, 7:55 AM

## 2018-02-13 NOTE — Lactation Note (Signed)
This note was copied from a baby's chart. Lactation Consultation Note  Patient Name: Kristi Novak [id number] Date: 02/13/2018 Reason for consult: Initial assessment;1st time breastfeeding;Early term 26-38.6wks  20 hours old early term female who is being exclusively BF by her mother, she's a P2 but not experienced BF, she didn't BF her first child. Mom participated in the [facility] program at the [facility] but she's not familiar with hand expression. When LC revised hand expression with mom noticed that her breast were large and soft, her nipples are short shafted but her tissue is very compressible. No colostrum was observed yet.  Mom was already nursing baby in typical cradle position when entering the room, she wasn't doing STS though, baby had her clothes on. Advised mom to try STS on posterior feedings. Helped her reposition baby a little deeper with cross cradle, mom able to do the latching on her own, however, no audible swallows noted at this point, noticed some long movements of the jaw though. Baby still nursing when exiting the room.   Mom requested a DEBP to take home. Explained to her that we can only take home hand pumps, and the one in her room is twas hers to take home. Gave mom some options about the [facility] program but she voiced, she's probably just going to get formula through [facility] and not the pump. Discussed cluster feeding, normal newborn behavior and feeding cues.  Feeding plan:  1. Encouraged mom to feed baby STS 8-12 times/24 hours or sooner if feeding cues are present 2. Hand expression and finger/spoon feeding was also encouraged  BF brochure, BF resources and feeding diary were reviewed. Mom reported all questions and concerns were answered, she's aware of LC services and will call PRN.  Maternal Data Formula Feeding for Exclusion: Yes Reason for exclusion: Mother's choice to formula and breast feed on admission Has patient been taught Hand Expression?: Yes Does the patient have  breastfeeding experience prior to this delivery?: No(She didn't BF her first child)  Feeding Feeding Type: Breast Fed  LATCH Score Latch: Grasps breast easily, tongue down, lips flanged, rhythmical sucking.  Audible Swallowing: None  Type of Nipple: Everted at rest and after stimulation(short shafted)  Comfort (Breast/Nipple): Soft / non-tender  Hold (Positioning): Assistance needed to correctly position infant at breast and maintain latch.(minimal assitance needed)  LATCH Score: 7  Interventions Interventions: Breast feeding basics reviewed;Assisted with latch;Skin to skin;Breast massage;Hand express;Pre-pump if needed;Hand pump;Breast compression;Adjust position;Support pillows  Lactation Tools Discussed/Used Tools: Pump Breast pump type: Manual WIC Program: Yes Pump Review: Setup, frequency, and cleaning Initiated by:: RN Date initiated:: 02/13/18   Consult Status Consult Status: Follow-up Date: 02/14/18 Follow-up type: In-patient    Draxton Luu Venetia Constable 02/13/2018, 5:57 PM

## 2018-02-14 ENCOUNTER — Other Ambulatory Visit: Payer: Self-pay

## 2018-02-14 MED ORDER — ACETAMINOPHEN 325 MG PO TABS: 650.0000 mg | ORAL_TABLET | ORAL | 0 refills | Status: DC | PRN

## 2018-02-14 MED ORDER — SENNOSIDES-DOCUSATE SODIUM 8.6-50 MG PO TABS: 2.0000 | ORAL_TABLET | ORAL | 0 refills | Status: DC

## 2018-02-14 MED ORDER — IBUPROFEN 600 MG PO TABS: 600.0000 mg | ORAL_TABLET | Freq: Four times a day (QID) | ORAL | 0 refills | Status: DC

## 2018-02-14 NOTE — Lactation Note (Signed)
This note was copied from a baby's chart. Lactation Consultation Note  Patient Name: Girl Jamiya Kellas [id number] Date: 02/14/2018   Infant is 32 hrs old. Mom feels like breastfeeding is going well. Infant has had 2 bottles of formula since birth. Uric acid crystals were noted in the most recent diaper. Mom reports she has seen a total of 2-3 voids with uric acid crystals. Hand expression was taught to Mom & Mom was able to return demonstration. A small amount of colostrum was obtained. Mom reports + breast changes w/pregnancy (increase in size).    Mother's intellect seems somewhat limited. Her head shape is suggestive of an early birth herself. Mom reports that she was born "3 months early" and only weighed 1 lb 2 oz at birth. There is a small scar where her R breast meets the chest wall.   Mom has my # to call to assess next feeding at breast. If a bottle needs to be used, we will attempt the Similac slow-flow (yellow) nipple b/c Mom commented that infant drinks "fast" with the Enfamil slow-flow (green) nipple.  Clydie Braun, RN was given update.   [name] [facility] 02/14/2018, 9:41 AM

## 2018-02-14 NOTE — Lactation Note (Addendum)
This note was copied from a baby's chart. Lactation Consultation Note  Patient Name: Kristi Novak [id number] Date: 02/14/2018 Reason for consult: Follow-up assessment  Mom was shown how to wash colostrum vials & breast pump parts.   Mom to call out for help if she needs help with latching/having an RN observe infant at breast; doing paced bottle feeding; or feeding any EBM to infant if it is not enough to put into a bottle.  Mom's feeding intention is to do formula & breast milk. Mom knows to aim to feed infant q3hrs (or sooner if infant cues).   [name] [facility] 02/14/2018, 3:08 PM

## 2018-02-14 NOTE — Lactation Note (Signed)
This note was copied from a baby's chart. Lactation Consultation Note  Patient Name: Girl Lashaunta Weinhardt Today's Date: 02/14/2018   Mom did not know to wake infant for feedings. Infant was fed small amount of EBM from hand expression earlier (on a gloved finger). Excellent cupping & suction of tongue noted, but infant still sleepy. Mom is currently hand expressing more now. LC to return shortly.   [name] [facility] 02/14/2018, 12:13 PM

## 2018-02-14 NOTE — Lactation Note (Signed)
This note was copied from a baby's chart. Lactation Consultation Note  Patient Name: Kristi Novak [id number] Date: 02/14/2018 Reason for consult: Follow-up assessment  Mom seems to be getting more proficient at hand expression. She was able to self-express about 3 mL, which I finger-fed to infant with a curved-tip syringe. Latching was attempted, but infant was not sleepy. As it had been 6 hours since last bottle-feeding, I fed infant with formula.  I used the yellow-slow flow nipple b/c Mom reported that the green slow-flow nipple went "faster & faster." Infant did benefit from pacing the feed q 6-7 swallows, but infant began to noticeably do better as the bottle feeding progressed.   After Mom eats lunch, I will show her how to wash the colostrum vials & parts to her manual pump.  Clydie Braun, RN was given update.  [name] [facility] 02/14/2018, 1:22 PM

## 2018-02-15 LAB — CERVICOVAGINAL ANCILLARY ONLY
Bacterial vaginitis: NEGATIVE
Candida vaginitis: POSITIVE — AB
Chlamydia: NEGATIVE
Neisseria Gonorrhea: NEGATIVE
Trichomonas: NEGATIVE

## 2018-02-16 ENCOUNTER — Other Ambulatory Visit: Payer: Self-pay | Admitting: Obstetrics

## 2018-02-16 DIAGNOSIS — B3731 Acute candidiasis of vulva and vagina: Secondary | ICD-10-CM

## 2018-02-16 DIAGNOSIS — B373 Candidiasis of vulva and vagina: Secondary | ICD-10-CM

## 2018-02-16 MED ORDER — TERCONAZOLE 0.4 % VA CREA: 1.0000 | TOPICAL_CREAM | Freq: Every day | VAGINAL | 0 refills | Status: DC

## 2018-02-19 ENCOUNTER — Encounter: Payer: Medicaid Other | Admitting: Obstetrics

## 2018-03-12 ENCOUNTER — Ambulatory Visit: Payer: Medicaid Other | Admitting: Certified Nurse Midwife

## 2018-03-25 ENCOUNTER — Ambulatory Visit (INDEPENDENT_AMBULATORY_CARE_PROVIDER_SITE_OTHER): Payer: Medicaid Other | Admitting: Family Medicine

## 2018-03-25 ENCOUNTER — Other Ambulatory Visit: Payer: Self-pay

## 2018-03-25 DIAGNOSIS — Z30430 Encounter for insertion of intrauterine contraceptive device: Secondary | ICD-10-CM

## 2018-03-25 DIAGNOSIS — Z3202 Encounter for pregnancy test, result negative: Secondary | ICD-10-CM | POA: Diagnosis not present

## 2018-03-25 LAB — POCT URINE PREGNANCY: Preg Test, Ur: NEGATIVE

## 2018-03-25 MED ORDER — LEVONORGESTREL 19.5 MCG/DAY IU IUD
INTRAUTERINE_SYSTEM | Freq: Once | INTRAUTERINE | Status: AC
  Administered 2018-03-25: 15:00:00 via INTRAUTERINE

## 2018-03-25 NOTE — Progress Notes (Signed)
Post Partum Exam  Kristi Novak is a 23 y.o. [id number] female who presents for a postpartum visit. She is 6 weeks postpartum following a spontaneous vaginal delivery. I have fully reviewed the prenatal and intrapartum course. The delivery was at 37.5 gestational weeks.  Anesthesia: epidural. Postpartum course has been doing well. Baby's course has been doing well. Baby is feeding by both breast and bottle - Carnation Good Start and good start. Bleeding no bleeding. Bowel function is normal. Bladder function is normal. Patient is sexually active.  Last intercourse 1 week ago. Contraception method is none.  Postpartum depression screening:neg, score 0. I have independently reviewed the above information and have verified hx with the patient.  The following portions of the patient's history were reviewed and updated as appropriate: allergies, current medications, past family history, past medical history, past social history, past surgical history and problem list. Last pap smear done 07/2017 and was Normal  Review of Systems Pertinent items noted in HPI and remainder of comprehensive ROS otherwise negative.    Objective:  unknown if currently breastfeeding.  General:  alert, cooperative and appears stated age  Lungs: normal effort  Heart:  regular rate and rhythm  Abdomen: soft, non-tender; bowel sounds normal; no masses,  no organomegaly   Vulva:  normal  Vagina: normal vagina  Cervix:  multiparous appearance   Procedure:   Patient identified, informed consent performed, signed copy in chart, time out was performed.  Urine pregnancy test negative.  Speculum placed in the vagina.  Cervix visualized.  Cleaned with Betadine x 2.  Grasped anteriourly with a single tooth tenaculum.  Uterus sounded to 10 cm.  Liletta IUD placed per manufacturer's recommendations.  Strings trimmed to 3 cm.   Patient given post procedure instructions and Liletta care card with expiration date.    Assessment:    Nml postpartum exam. Pap smear not done at today's visit.   Plan:   1. Contraception: IUD 2. Given unprotected intercourse 1 wk ago, negative UPT, unlikely to be pregnant, but not impossible, will repeat UPT in 2 wks with IUD string check. 3. Follow up in: 2 weeks or as needed.

## 2018-03-25 NOTE — Patient Instructions (Addendum)

## 2018-04-08 ENCOUNTER — Ambulatory Visit (INDEPENDENT_AMBULATORY_CARE_PROVIDER_SITE_OTHER): Payer: Medicaid Other

## 2018-04-08 ENCOUNTER — Other Ambulatory Visit: Payer: Self-pay

## 2018-04-08 VITALS — BP 131/94 | HR 66 | Wt 207.0 lb

## 2018-04-08 DIAGNOSIS — Z309 Encounter for contraceptive management, unspecified: Secondary | ICD-10-CM

## 2018-04-08 DIAGNOSIS — Z30431 Encounter for routine checking of intrauterine contraceptive device: Secondary | ICD-10-CM | POA: Diagnosis not present

## 2018-04-08 DIAGNOSIS — Z3202 Encounter for pregnancy test, result negative: Secondary | ICD-10-CM

## 2018-04-08 LAB — POCT URINE PREGNANCY: Preg Test, Ur: NEGATIVE

## 2018-04-08 NOTE — Progress Notes (Signed)
RGYN pt presents for IUD Check Inserted on 03/25/18 per last notes repeat UPT w/ IUD String check.   CC: NONE

## 2018-04-08 NOTE — Progress Notes (Signed)
History:  Ms. Kristi Novak is a 23 y.o. [id number] who presents to clinic today for routine checking of IUD.  Patient with Penni Bombard IUD placed on March 18th.  She reports that she has not engaged in sexual activity since prior to insertion.  She states she has checked her strings with success and expresses concern with continued bleeding.  Patient reports bleeding is light, but continuous and denies passing clots.  Patient endorses safety at home and good social support system.   The following portions of the patient's history were reviewed and updated as appropriate: allergies, current medications, family history, past medical history, social history, past surgical history and problem list.  Review of Systems:  Review of Systems  Constitutional: Negative for chills and fever.  Genitourinary: Negative for dysuria.  Neurological: Negative for headaches.      Objective:  Physical Exam BP (!) 131/94   Pulse 66   Wt 207 lb (93.9 kg)   LMP 03/25/2018   Breastfeeding No   BMI 35.53 kg/m  Physical Exam Exam conducted with a chaperone present.  Genitourinary:    General: Normal vulva.     Labia:        Right: No tenderness.        Left: No tenderness.      Vagina: Bleeding present. No vaginal discharge.     Cervix: Cervical bleeding present. No cervical motion tenderness or friability.     Uterus: Not enlarged.      Adnexa:        Right: No mass or tenderness.         Left: No mass or tenderness.       Comments: Speculum Exam: -Vaginal Vault: Pink mucosa. Small amt blood noted.  -Cervix:Parous, Pink, No lesions, cysts, or polyps.  Blue strings from os, Active bleeding noted. Strings trimmed from 4cm to ~3cm.  -Bimanual Exam: No device palpated at external os.      Labs and Imaging Results for orders placed or performed in visit on 04/08/18 (from the past 24 hour(s))  POCT urine pregnancy     Status: None   Collection Time: 04/08/18 10:25 AM  Result Value Ref Range   Preg Test,  Ur Negative Negative    No results found.   Assessment & Plan:  1. Encounter for routine checking of intrauterine contraceptive device (IUD) -Discussed normalization of bleeding pattern after about 3-6 months s/p placement. -Encouraged to contact office if daily bleeding continues after 6 months or increases in flow with passing of clots. -Discussed condom usage for avoidance of STI with IUD. -No questions or concerns  -RTO in one year for annual exam or prn for issues.  Labs: - POCT urine pregnancy  Gerrit Heck, CNM 04/08/2018 10:20 AM

## 2018-08-27 ENCOUNTER — Other Ambulatory Visit: Payer: Self-pay

## 2018-08-27 ENCOUNTER — Ambulatory Visit (HOSPITAL_COMMUNITY)
Admission: EM | Admit: 2018-08-27 | Discharge: 2018-08-27 | Disposition: A | Discharge status: Home / Self Care | Payer: Medicaid Other | Attending: Family Medicine | Admitting: Family Medicine

## 2018-08-27 ENCOUNTER — Encounter (HOSPITAL_COMMUNITY): Payer: Self-pay

## 2018-08-27 DIAGNOSIS — Z3202 Encounter for pregnancy test, result negative: Secondary | ICD-10-CM | POA: Diagnosis not present

## 2018-08-27 DIAGNOSIS — N3090 Cystitis, unspecified without hematuria: Secondary | ICD-10-CM | POA: Insufficient documentation

## 2018-08-27 DIAGNOSIS — R1084 Generalized abdominal pain: Secondary | ICD-10-CM

## 2018-08-27 LAB — POCT URINALYSIS DIP (DEVICE)
Bilirubin Urine: NEGATIVE
Glucose, UA: NEGATIVE mg/dL
Hgb urine dipstick: NEGATIVE
Ketones, ur: NEGATIVE mg/dL
Nitrite: POSITIVE — AB
Protein, ur: 30 mg/dL — AB
Specific Gravity, Urine: >=1.03 (ref 1.005–1.030)
Urobilinogen, UA: 0.2 mg/dL (ref 0.0–1.0)
pH: 6 (ref 5.0–8.0)

## 2018-08-27 LAB — POCT PREGNANCY, URINE: Preg Test, Ur: NEGATIVE

## 2018-08-27 MED ORDER — CEPHALEXIN 500 MG PO CAPS: 500.0000 mg | ORAL_CAPSULE | Freq: Two times a day (BID) | ORAL | 0 refills | Status: DC

## 2018-08-27 NOTE — ED Triage Notes (Signed)
Pt states she has stomach pain x 2 days.

## 2018-08-27 NOTE — Discharge Instructions (Addendum)
Your abdominal pain may be caused by constipation. You may try using over-the-counter MIRALAX as directed for the next 2-3 days. Do your best to make sure you are drinking enough water.  I have also sent an antibiotic to your pharmacy to treat a bladder infection.

## 2018-08-27 NOTE — ED Provider Notes (Signed)
[facility]   [id number] 08/27/18 Arrival Time: 1024  ASSESSMENT & PLAN:  1. Generalized abdominal pain   2. Cystitis     Benign abdominal exam. No indications for urgent abdominal/pelvic imaging at this time. Discussed possibility of constipation in addition to cystitis. Urine culture sent.  To begin: Meds ordered this encounter  Medications  . cephALEXin (KEFLEX) 500 MG capsule    Sig: Take 1 capsule (500 mg total) by mouth 2 (two) times daily.    Dispense:  10 capsule    Refill:  0   .  Discharge Instructions     Your abdominal pain may be caused by constipation. You may try using over-the-counter MIRALAX as directed for the next 2-3 days. Do your best to make sure you are drinking enough water.   [place], [facility].   Specialty: Pediatrics Why: As needed. Contact information: [place] [address] [place].   Specialty: Emergency Medicine Why: If worsening or failing to improve as anticipated. Contact information: [address] [id number] [place] [phone]          Reviewed expectations re: course of current medical issues. Questions answered. Outlined signs and symptoms indicating need for more acute intervention. Patient verbalized understanding. After Visit Summary given.   SUBJECTIVE: History from: patient. Kristi Novak is a 23 y.o. female who presents with complaint of intermittent lower abdominal discomfort. Onset gradual, over the past couple of days. Discomfort described as 'a full feeling'; without radiation; does not wake her at night. Symptoms are stable since beginning. Fever: absent. Aggravating factors: have not been identified. Alleviating factors: have not been identified. Associated symptoms: mild fatigue. She denies arthralgias, belching, chills, diarrhea,  fever, myalgias and sweats. Appetite: normal. PO intake: normal. Ambulatory without assistance. Urinary symptoms: questions mild dysuria at times over the past few days. Bowel movements: are less frequent than before; last bowel movement 3-4 days ago and without blood. History of similar: no. No pelvic pain. OTC treatment: none reported.  No LMP recorded. (Menstrual status: IUD).   Past Surgical History:  Procedure Laterality Date  . EYE SURGERY  05-01-2005  [facility]   LEFT EYE  AND  1999 BILATERAL EYES 1999  . EYE SURGERY    . MEDIAN RECTUS REPAIR  08/21/2011   Procedure: MEDIAN RECTUS REPAIR;  Surgeon: Dara Hoyer, MD;  Location: [facility];  Service: Ophthalmology;  Laterality: Right;  inferior oblique myectomy  . TYMPANOSTOMY TUBE PLACEMENT  AGE 55   AND ADENOIDECTOMY  . WISDOM TOOTH EXTRACTION     ORAL SURGEON OFFICE    ROS: As per HPI. All other systems negative.  OBJECTIVE:  Vitals:   08/27/18 1058 08/27/18 1059  BP:  109/64  Pulse:  87  Resp:  18  Temp:  98.5 F (36.9 C)  TempSrc:  Oral  SpO2:  100%  Weight: 99.8 kg     General appearance: alert, oriented, no acute distress Lungs: clear to auscultation bilaterally; unlabored respirations Heart: regular rate and rhythm Abdomen: soft; without distention; mild and poorly localized lower abdominal tenderness; normal bowel sounds; without masses or organomegaly; without guarding or rebound tenderness Back: without CVA tenderness; FROM at waist Extremities: without LE edema; symmetrical; without gross deformities Skin: warm and dry Neurologic: normal gait Psychological: alert and cooperative; normal mood and affect  Labs: Results for  orders placed or performed in visit on 04/08/18  POCT urine pregnancy  Result Value Ref Range   Preg Test, Ur Negative Negative   Labs Reviewed  POC URINE PREG, ED  POCT URINALYSIS DIP (DEVICE)    No Known Allergies                                              Past Medical History:  Diagnosis Date  . H/O chlamydia infection   . H/O trichomoniasis   . Hearing loss   . 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 Hypertropia RIGHT EYE  . 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 Marital status: Single    Spouse name: Not on file  . Number of children: 1  . Years of education: Not on file  . Highest education level: Not on file  Occupational History  . Occupation: Consulting civil engineerstudent  Social Needs  . Financial resource strain: Not on file  . Food insecurity    Worry: Not on file    Inability: Not on file  . Transportation needs    Medical: Not on file    Non-medical: Not on file  Tobacco Use  . Smoking status: Never Smoker  . Smokeless tobacco: Never Used  Substance and Sexual Activity  . Alcohol use: No  . Drug use: No  . Sexual activity: Yes    Partners: Male    Birth control/protection: None, I.U.D.  Lifestyle  . Physical activity    Days per week: Not on file    Minutes per session: Not on file  . Stress: Not on file  Relationships  . Social Musicianconnections    Talks on phone: Not on file    Gets together: Not on file    Attends religious service: Not on file    Active member of club or organization: Not on file    Attends meetings of clubs or organizations: Not on file    Relationship status: Not on file  . Intimate partner violence    Fear of current or ex partner: Not on file    Emotionally abused: Not on file    Physically abused: Not on file    Forced sexual activity: Not on file  Other Topics Concern  . Not on file  Social History Narrative   Lives with mother, grandmother and child.     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    Family History  Problem Relation Age of Onset  . Cancer Maternal Grandmother        breast  . Hypertension Other   . Healthy Mother   . Healthy Father      Mardella LaymanHagler, Maxime Beckner, MD 09/01/18  [phone]

## 2018-08-28 LAB — URINE CULTURE: Culture: >=100000 — AB

## 2019-01-11 ENCOUNTER — Encounter (HOSPITAL_COMMUNITY): Payer: Self-pay

## 2019-01-11 ENCOUNTER — Other Ambulatory Visit: Payer: Self-pay

## 2019-01-11 ENCOUNTER — Ambulatory Visit (HOSPITAL_COMMUNITY)
Admission: EM | Admit: 2019-01-11 | Discharge: 2019-01-11 | Disposition: A | Discharge status: Home / Self Care | Payer: Medicaid Other | Attending: Family Medicine | Admitting: Family Medicine

## 2019-01-11 DIAGNOSIS — Z3202 Encounter for pregnancy test, result negative: Secondary | ICD-10-CM

## 2019-01-11 DIAGNOSIS — N730 Acute parametritis and pelvic cellulitis: Secondary | ICD-10-CM | POA: Insufficient documentation

## 2019-01-11 LAB — POCT URINALYSIS DIP (DEVICE)
Bilirubin Urine: NEGATIVE
Glucose, UA: NEGATIVE mg/dL
Ketones, ur: NEGATIVE mg/dL
Nitrite: NEGATIVE
Protein, ur: NEGATIVE mg/dL
Specific Gravity, Urine: 1.025 (ref 1.005–1.030)
Urobilinogen, UA: 1 mg/dL (ref 0.0–1.0)
pH: 7.5 (ref 5.0–8.0)

## 2019-01-11 LAB — POC URINE PREG, ED
Preg Test, Ur: NEGATIVE
Preg Test, Ur: NEGATIVE

## 2019-01-11 LAB — POCT PREGNANCY, URINE: Preg Test, Ur: NEGATIVE

## 2019-01-11 MED ORDER — METRONIDAZOLE 500 MG PO TABS: 500.0000 mg | ORAL_TABLET | Freq: Two times a day (BID) | ORAL | 0 refills | Status: DC

## 2019-01-11 MED ORDER — IBUPROFEN 800 MG PO TABS
ORAL_TABLET | ORAL | Status: AC
  Filled 2019-01-11: qty 1

## 2019-01-11 MED ORDER — IBUPROFEN 800 MG PO TABS
800.0000 mg | ORAL_TABLET | Freq: Once | ORAL | Status: AC
  Administered 2019-01-11: 800 mg via ORAL

## 2019-01-11 MED ORDER — LIDOCAINE HCL 2 % IJ SOLN
INTRAMUSCULAR | Status: AC
  Filled 2019-01-11: qty 20

## 2019-01-11 MED ORDER — CEFTRIAXONE SODIUM 1 G IJ SOLR
INTRAMUSCULAR | Status: AC
  Filled 2019-01-11: qty 10

## 2019-01-11 MED ORDER — DOXYCYCLINE HYCLATE 100 MG PO CAPS: 100.0000 mg | ORAL_CAPSULE | Freq: Two times a day (BID) | ORAL | 0 refills | Status: DC

## 2019-01-11 MED ORDER — CEFTRIAXONE SODIUM 1 G IJ SOLR: 0.5000 g | Freq: Once | INTRAMUSCULAR | Status: DC

## 2019-01-11 NOTE — Discharge Instructions (Addendum)
Treating you for pelvic inflammatory disease. Antibiotic injection given here in clinic.  I am sending 2 prescriptions to the pharmacy 1 is Flagyl to take twice a day for 7 days and the other is doxycycline to take twice a day for 7 days. Take the medication as prescribed.  Take with food.  Do not drink while taking this medication. Avoid sexual intercourse I was able to view the IUD strings. Follow up with OB/GYN

## 2019-01-12 ENCOUNTER — Other Ambulatory Visit: Payer: Self-pay

## 2019-01-12 ENCOUNTER — Ambulatory Visit (HOSPITAL_COMMUNITY)
Admission: EM | Admit: 2019-01-12 | Discharge: 2019-01-12 | Disposition: A | Discharge status: Home / Self Care | Payer: Medicaid Other | Attending: Family Medicine | Admitting: Family Medicine

## 2019-01-12 DIAGNOSIS — N730 Acute parametritis and pelvic cellulitis: Secondary | ICD-10-CM | POA: Diagnosis not present

## 2019-01-12 MED ORDER — ONDANSETRON HCL 4 MG PO TABS: 4.0000 mg | ORAL_TABLET | Freq: Two times a day (BID) | ORAL | 0 refills | Status: DC

## 2019-01-12 NOTE — ED Provider Notes (Signed)
Patient was seen yesterday.  She is here today stating that the medicines are making her sick to her stomach.  She would like a different medication. I asked the nurse to advise her to continue taking these medicines because they are the best choice for her condition.  We offered her the Rocephin injection that she rejected yesterday.  She rechecks it again today. The decision was made to call Zofran into her pharmacy.  She is told to take this 30 minutes prior to her medication, and be sure to take the medication with food. She should call if she continues to have problems with medication tolerance She will be called with her culture results when they are available   Eustace Moore, MD 01/12/19 [phone]

## 2019-01-12 NOTE — ED Notes (Signed)
Pt here for N&V following taking her antibiotics.  Dr. Delton See and Dahlia Byes, NP recommended she take zofran 30 min before taking her antibiotics and to take her antibiotics with food.  Pt agreeable to this plan at this time.  It was explained that the pt cannot use a cream for her symptoms and that the Rocephin injection she refused is the best course of action for her symptoms.  Pt still not wanting to get the injection.  Rx for Zofran sent to her pharmacy and pt left, A&O and in NAD.

## 2019-01-12 NOTE — ED Provider Notes (Signed)
[facility]    CSN: [id number] Arrival date & time: 01/11/19  1409      History   Chief Complaint Chief Complaint  Patient presents with  . Abdominal Pain    HPI Kristi Novak is a 24 y.o. female.   Patient is a 24 year old female that presents today with lower abdominal discomfort since yesterday.  Symptoms been constant and worsening.  She is concerned because she has been unable to locate her IUD strings.  IUD placed in March.  Currently sexually active with one partner, unprotected.  Denies any vaginal discharge, vaginal bleeding vaginal itching or irritation.  Denies any dysuria, hematuria,, urinary frequency or fevers.  She has not take anything for her symptoms.  ROS per HPI      Past Medical History:  Diagnosis Date  . H/O chlamydia infection   . H/O trichomoniasis   . Hearing loss   . 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 Hypertropia RIGHT EYE  . 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 Acid reflux 07/24/2017  . H/O chlamydia infection   . H/O trichomoniasis   . Hypertropia 08/20/2011    Past Surgical History:  Procedure Laterality Date  . EYE SURGERY  05-01-2005  [facility]   LEFT EYE  AND  1999 BILATERAL EYES 1999  . EYE SURGERY    . MEDIAN RECTUS REPAIR  08/21/2011   Procedure: MEDIAN RECTUS REPAIR;  Surgeon: Corinda Gubler, MD;  Location: [facility];  Service: Ophthalmology;  Laterality: Right;  inferior oblique myectomy  . TYMPANOSTOMY TUBE PLACEMENT  AGE 44   AND ADENOIDECTOMY  . WISDOM TOOTH EXTRACTION     ORAL SURGEON OFFICE    OB History    Gravida  2   Para  2   Term  2   Preterm  0   AB  0   Living  2     SAB  0   TAB  0   Ectopic  0   Multiple  0   Live Births  2            Home Medications    Prior to Admission medications   Medication Sig Start Date End Date Taking? Authorizing  Provider  doxycycline (VIBRAMYCIN) 100 MG capsule Take 1 capsule (100 mg total) by mouth 2 (two) times daily. 01/11/19   Dahlia Byes A, NP  metroNIDAZOLE (FLAGYL) 500 MG tablet Take 1 tablet (500 mg total) by mouth 2 (two) times daily. 01/11/19   Janace Aris, NP    Family History Family History  Problem Relation Age of Onset  . Cancer Maternal Grandmother        breast  . Hypertension Other   . Healthy Mother   . Healthy Father     Social History Social History   Tobacco Use  . Smoking status: Never Smoker  . Smokeless tobacco: Never Used  Substance Use Topics  . Alcohol use: No  . Drug use: No     Allergies   Patient has no known allergies.   Review of Systems Review of Systems   Physical Exam Triage Vital Signs ED Triage Vitals  Enc Vitals Group     BP 01/11/19 1513 133/79     Pulse Rate 01/11/19 1513 87     Resp 01/11/19 1513 16  Temp 01/11/19 1513 98.2 F (36.8 C)     Temp Source 01/11/19 1513 Oral     SpO2 01/11/19 1513 100 %     Weight --      Height --      Head Circumference --      Peak Flow --      Pain Score 01/11/19 1515 8     Pain Loc --      Pain Edu? --      Excl. in [place]? --    No data found.  Updated Vital Signs BP 133/79 (BP Location: Left Arm)   Pulse 87   Temp 98.2 F (36.8 C) (Oral)   Resp 16   SpO2 100%   Visual Acuity Right Eye Distance:   Left Eye Distance:   Bilateral Distance:    Right Eye Near:   Left Eye Near:    Bilateral Near:     Physical Exam Vitals and nursing note reviewed.  Constitutional:      Appearance: She is well-developed.     Comments: Appears in pain   HENT:     Head: Normocephalic and atraumatic.  Pulmonary:     Effort: Pulmonary effort is normal.  Abdominal:     General: Abdomen is flat. Bowel sounds are normal.     Palpations: Abdomen is soft.     Tenderness: There is abdominal tenderness in the right lower quadrant, suprapubic area and left lower quadrant. There is guarding.    Genitourinary:    Cervix: Cervical motion tenderness and discharge present. No lesion, cervical bleeding or eversion.     Adnexa: Right adnexa normal and left adnexa normal.     Comments: External vaginal exam without lesions, swelling or redness. White/yellow purulent discharge noted in vaginal vault and from cervix. IUD strings viewable Neurological:     Mental Status: She is alert.      UC Treatments / Results  Labs (all labs ordered are listed, but only abnormal results are displayed) Labs Reviewed  POCT URINALYSIS DIP (DEVICE) - Abnormal; Notable for the following components:      Result Value   Hgb urine dipstick TRACE (*)    Leukocytes,Ua LARGE (*)    All other components within normal limits  POC URINE PREG, ED  POCT PREGNANCY, URINE  POC URINE PREG, ED  CERVICOVAGINAL ANCILLARY ONLY    EKG   Radiology No results found.  Procedures Procedures (including critical care time)  Medications Ordered in UC Medications  ibuprofen (ADVIL) tablet 800 mg (800 mg Oral Given 01/11/19 1614)    Initial Impression / Assessment and Plan / UC Course  I have reviewed the triage vital signs and the nursing notes.  Pertinent labs & imaging results that were available during my care of the patient were reviewed by me and considered in my medical decision making (see chart for details).     Pelvic inflammatory disease.-Most likely diagnosis based on exam and symptoms. Will treat with 500 Rocephin, doxycycline and Flagyl. Ibuprofen for pain. Patient refused Rocephin injection due to fear of needles. Explained to patient the importance of taking this medication due to resistance with STDs in this community. Patient still refusing.  Also offered Toradol here for pain but patient refused. Recommended safe sex practices. If symptoms continue or worsen she will need to go to the ER. Patient understanding and agree. Final Clinical Impressions(s) / UC Diagnoses   Final diagnoses:   PID (acute pelvic inflammatory disease)     Discharge  Instructions     Treating you for pelvic inflammatory disease. Antibiotic injection given here in clinic.  I am sending 2 prescriptions to the pharmacy 1 is Flagyl to take twice a day for 7 days and the other is doxycycline to take twice a day for 7 days. Take the medication as prescribed.  Take with food.  Do not drink while taking this medication. Avoid sexual intercourse I was able to view the IUD strings. Follow up with OB/GYN    ED Prescriptions    Medication Sig Dispense Auth. Provider   doxycycline (VIBRAMYCIN) 100 MG capsule Take 1 capsule (100 mg total) by mouth 2 (two) times daily. 20 capsule Broady Lafoy A, NP   metroNIDAZOLE (FLAGYL) 500 MG tablet Take 1 tablet (500 mg total) by mouth 2 (two) times daily. 14 tablet Creedence Heiss A, NP     PDMP not reviewed this encounter.   Janace Aris, NP 01/12/19 1601

## 2019-01-13 ENCOUNTER — Telehealth (HOSPITAL_COMMUNITY): Payer: Self-pay | Admitting: Emergency Medicine

## 2019-01-13 LAB — CERVICOVAGINAL ANCILLARY ONLY
Bacterial vaginitis: POSITIVE — AB
Candida vaginitis: POSITIVE — AB
Chlamydia: NEGATIVE
Neisseria Gonorrhea: NEGATIVE
Trichomonas: POSITIVE — AB

## 2019-01-13 MED ORDER — FLUCONAZOLE 150 MG PO TABS: 150.0000 mg | ORAL_TABLET | Freq: Once | ORAL | 0 refills | Status: AC

## 2019-01-13 NOTE — Telephone Encounter (Signed)
Bacterial Vaginosis test is positive.  Prescription for metronidazole was given at the urgent care visit.  Trichomonas is positive. Rx metronidazole was given at the urgent care visit. Pt needs education to please refrain from sexual intercourse for 7 days to give the medicine time to work. Sexual partners need to be notified and tested/treated. Condoms may reduce risk of reinfection. Recheck for further evaluation if symptoms are not improving.   Test for candida (yeast) was positive.  Prescription for fluconazole 150mg  po now, repeat dose in 3d if needed, #2 no refills, sent to the pharmacy of record.  Recheck or followup with PCP for further evaluation if symptoms are not improving.    Patient contacted by phone and made aware of    results. Pt verbalized understanding and had all questions answered.

## 2019-02-05 ENCOUNTER — Other Ambulatory Visit: Payer: Self-pay

## 2019-02-05 ENCOUNTER — Ambulatory Visit (HOSPITAL_COMMUNITY)
Admission: EM | Admit: 2019-02-05 | Discharge: 2019-02-05 | Disposition: A | Discharge status: Home / Self Care | Payer: Medicaid Other | Attending: Urgent Care | Admitting: Urgent Care

## 2019-02-05 ENCOUNTER — Encounter (HOSPITAL_COMMUNITY): Payer: Self-pay

## 2019-02-05 DIAGNOSIS — B373 Candidiasis of vulva and vagina: Secondary | ICD-10-CM

## 2019-02-05 DIAGNOSIS — A599 Trichomoniasis, unspecified: Secondary | ICD-10-CM | POA: Diagnosis present

## 2019-02-05 DIAGNOSIS — B3731 Acute candidiasis of vulva and vagina: Secondary | ICD-10-CM

## 2019-02-05 DIAGNOSIS — N760 Acute vaginitis: Secondary | ICD-10-CM | POA: Diagnosis present

## 2019-02-05 DIAGNOSIS — B9689 Other specified bacterial agents as the cause of diseases classified elsewhere: Secondary | ICD-10-CM | POA: Diagnosis present

## 2019-02-05 LAB — POCT URINALYSIS DIP (DEVICE)
Bilirubin Urine: NEGATIVE
Glucose, UA: NEGATIVE mg/dL
Ketones, ur: NEGATIVE mg/dL
Leukocytes,Ua: NEGATIVE
Nitrite: NEGATIVE
Protein, ur: NEGATIVE mg/dL
Specific Gravity, Urine: >=1.03 (ref 1.005–1.030)
Urobilinogen, UA: 0.2 mg/dL (ref 0.0–1.0)
pH: 6 (ref 5.0–8.0)

## 2019-02-05 MED ORDER — FLUCONAZOLE 150 MG PO TABS: 150.0000 mg | ORAL_TABLET | ORAL | 0 refills | Status: DC

## 2019-02-05 NOTE — Discharge Instructions (Signed)
Avoid all forms of sexual intercourse (oral, vaginal, anal) for the next 7 days to avoid spreading/reinfecting. Return if symptoms worsen/do not resolve, you develop fever, abdominal pain, blood in your urine, or are re-exposed to an STI.  

## 2019-02-05 NOTE — ED Triage Notes (Signed)
Pt states she only has frequent urination as of now but was here on 01/11/2019 for UTI. States she never went to pick up her prescribed meds from her 01/11/2019, states she needs more.

## 2019-02-05 NOTE — ED Notes (Signed)
Bed: UC02 Expected date:  Expected time:  Means of arrival:  Comments: 1130 Appt

## 2019-02-05 NOTE — ED Provider Notes (Signed)
[facility]   MRN: [id number] DOB: 1995-06-10  Subjective:   Kristi Novak is a 24 y.o. female presenting for recheck and clarification on her lab results and need for treatment.  At her last office visit on 01/11/2019, patient was treated for PID with metronidazole and doxycycline, IM ceftriaxone.  Patient was also given prescriptions for Zofran and fluconazole.  Lab results showed positive BV, trichomoniasis and yeast infection only.  She states that she did not end up taking the doxycycline or the fluconazole but she did take the metronidazole and has used Zofran as needed.  At her last office visit, she did have concerns about her IUD.  She states since then her IUD came out in its entirety and saw the strings attached with it.  She has not reached out to her gynecologist.    No Known Allergies  Past Medical History:  Diagnosis Date  . H/O chlamydia infection   . H/O trichomoniasis   . Hearing loss   . 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 Hypertropia RIGHT EYE  . Seizures ([facility]) 2007   Idiopathic   had two seizures (none since)     Past Surgical History:  Procedure Laterality Date  . EYE SURGERY  05-01-2005  [facility]   LEFT EYE  AND  1999 BILATERAL EYES 1999  . EYE SURGERY    . MEDIAN RECTUS REPAIR  08/21/2011   Procedure: MEDIAN RECTUS REPAIR;  Surgeon: Corinda Gubler, MD;  Location: [facility];  Service: Ophthalmology;  Laterality: Right;  inferior oblique myectomy  . TYMPANOSTOMY TUBE PLACEMENT  AGE 23   AND ADENOIDECTOMY  . WISDOM TOOTH EXTRACTION     ORAL SURGEON OFFICE    Family History  Problem Relation Age of Onset  . Cancer Maternal Grandmother        breast  . Hypertension Other   . Healthy Mother   . Healthy Father     Social History   Tobacco Use  . Smoking status: Never Smoker  . Smokeless tobacco: Never Used  Substance Use Topics  . Alcohol use: No  . Drug use: No    Review of Systems    Constitutional: Negative for chills and fever.  Respiratory: Negative for shortness of breath.   Cardiovascular: Negative for chest pain.  Gastrointestinal: Negative for abdominal pain, blood in stool, constipation, diarrhea, nausea and vomiting.  Genitourinary: Negative for dysuria, flank pain, frequency, hematuria and urgency.       Denies vaginal discharge, pelvic pain, genital rash.  Musculoskeletal: Negative for myalgias.  Skin: Negative for rash.  Neurological: Negative for dizziness and headaches.     Objective:   Vitals: BP 108/74 (BP Location: Left Arm)   Pulse 78   Temp 98.7 F (37.1 C) (Oral)   Resp 19   Wt 210 lb 9.6 oz (95.5 kg)   LMP 01/29/2019   SpO2 100%   Breastfeeding No   BMI 36.15 kg/m   Physical Exam Constitutional:      General: She is not in acute distress.    Appearance: Normal appearance. She is well-developed. She is obese. She is not ill-appearing, toxic-appearing or diaphoretic.  HENT:     Head: Normocephalic and atraumatic.     Nose: Nose normal.     Mouth/Throat:     Mouth: Mucous membranes are moist.     Pharynx: Oropharynx is clear.  Eyes:     General: No scleral icterus.  Extraocular Movements: Extraocular movements intact.     Pupils: Pupils are equal, round, and reactive to light.  Cardiovascular:     Rate and Rhythm: Normal rate.  Pulmonary:     Effort: Pulmonary effort is normal.  Skin:    General: Skin is warm and dry.  Neurological:     General: No focal deficit present.     Mental Status: She is alert and oriented to person, place, and time.  Psychiatric:        Mood and Affect: Mood normal.        Behavior: Behavior normal.        Thought Content: Thought content normal.        Judgment: Judgment normal.     Results for orders placed or performed during the hospital encounter of 02/05/19 (from the past 24 hour(s))  POCT urinalysis dip (device)     Status: Abnormal   Collection Time: 02/05/19 11:51 AM  Result  Value Ref Range   Glucose, UA NEGATIVE NEGATIVE mg/dL   Bilirubin Urine NEGATIVE NEGATIVE   Ketones, ur NEGATIVE NEGATIVE mg/dL   Specific Gravity, Urine >=1.030 1.005 - 1.030   Hgb urine dipstick TRACE (A) NEGATIVE   pH 6.0 5.0 - 8.0   Protein, ur NEGATIVE NEGATIVE mg/dL   Urobilinogen, UA 0.2 0.0 - 1.0 mg/dL   Nitrite NEGATIVE NEGATIVE   Leukocytes,Ua NEGATIVE NEGATIVE    Assessment and Plan :   1. Trichomoniasis   2. Yeast vaginitis   3. BV (bacterial vaginosis)     Prescription sent again for fluconazole to treat her yeast vaginitis.  Labs pending, will treat as appropriate.  Vital signs are very stable, patient is asymptomatic and was agreeable to holding off on any further treatment.  Counseled that she should set up an office visit for recheck with her gynecologist regarding her IUD. Counseled patient on potential for adverse effects with medications prescribed/recommended today, ER and return-to-clinic precautions discussed, patient verbalized understanding.    Jaynee Eagles, PA-C 02/05/19 1245

## 2019-02-09 ENCOUNTER — Encounter (HOSPITAL_COMMUNITY): Payer: Self-pay

## 2019-02-09 ENCOUNTER — Telehealth (HOSPITAL_COMMUNITY): Payer: Self-pay | Admitting: Emergency Medicine

## 2019-02-09 LAB — CERVICOVAGINAL ANCILLARY ONLY
Bacterial vaginitis: POSITIVE — AB
Candida vaginitis: POSITIVE — AB
Chlamydia: NEGATIVE
Neisseria Gonorrhea: NEGATIVE
Trichomonas: NEGATIVE

## 2019-02-09 MED ORDER — METRONIDAZOLE 500 MG PO TABS: 500.0000 mg | ORAL_TABLET | Freq: Two times a day (BID) | ORAL | 0 refills | Status: AC

## 2019-02-09 NOTE — Telephone Encounter (Signed)
Candida (yeast) is positive.  Prescription for fluconazole was given at the urgent care visit.    Bacterial vaginosis is positive. Pt needs treatment. Flagyl 500 mg BID x 7 days #14 no refills sent to patients pharmacy of choice.    Attempted to reach patient. No answer at this time. Voicemail left.   If you have any questions, you may call me at [phone]

## 2019-06-11 ENCOUNTER — Ambulatory Visit (HOSPITAL_COMMUNITY)
Admission: EM | Admit: 2019-06-11 | Discharge: 2019-06-11 | Disposition: A | Discharge status: Home / Self Care | Payer: Medicaid Other | Attending: Family Medicine | Admitting: Family Medicine

## 2019-06-11 ENCOUNTER — Encounter (HOSPITAL_COMMUNITY): Payer: Self-pay

## 2019-06-11 ENCOUNTER — Other Ambulatory Visit: Payer: Self-pay

## 2019-06-11 DIAGNOSIS — Z202 Contact with and (suspected) exposure to infections with a predominantly sexual mode of transmission: Secondary | ICD-10-CM | POA: Diagnosis not present

## 2019-06-11 DIAGNOSIS — Z3202 Encounter for pregnancy test, result negative: Secondary | ICD-10-CM

## 2019-06-11 DIAGNOSIS — N760 Acute vaginitis: Secondary | ICD-10-CM | POA: Diagnosis present

## 2019-06-11 DIAGNOSIS — N898 Other specified noninflammatory disorders of vagina: Secondary | ICD-10-CM

## 2019-06-11 LAB — POCT URINALYSIS DIP (DEVICE)
Bilirubin Urine: NEGATIVE
Glucose, UA: NEGATIVE mg/dL
Hgb urine dipstick: NEGATIVE
Ketones, ur: NEGATIVE mg/dL
Nitrite: NEGATIVE
Protein, ur: NEGATIVE mg/dL
Specific Gravity, Urine: >=1.03 (ref 1.005–1.030)
Urobilinogen, UA: 0.2 mg/dL (ref 0.0–1.0)
pH: 6.5 (ref 5.0–8.0)

## 2019-06-11 LAB — POC URINE PREG, ED: Preg Test, Ur: NEGATIVE

## 2019-06-11 MED ORDER — FLUCONAZOLE 150 MG PO TABS: 150.0000 mg | ORAL_TABLET | ORAL | 0 refills | Status: DC

## 2019-06-11 MED ORDER — METRONIDAZOLE 500 MG PO TABS: 500.0000 mg | ORAL_TABLET | Freq: Two times a day (BID) | ORAL | 0 refills | Status: DC

## 2019-06-11 NOTE — ED Provider Notes (Signed)
[place]    CSN: [id number] Arrival date & time: 06/11/19  1053      History   Chief Complaint Chief Complaint  Patient presents with  . Vaginal Discharge    HPI Kristi Novak is a 24 y.o. female.   HPI   Patient admits to unprotected sexual relations She does not, however, feel like she has an STD She does have recurring yeast infections and BV  she has some vaginal discharge today, no itching or burning no abdominal pain no nausea or vomiting no fever No dysuria, urinary frequency, or flank pain to indicate urinary tract infection Past Medical History:  Diagnosis Date  . H/O chlamydia infection   . H/O trichomoniasis   . Hearing loss   . 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 Hypertropia RIGHT EYE  . Seizures ([place]) 2007   Idiopathic   had two seizures (none since)    Patient Active Problem List   Diagnosis Date Noted  . Acid reflux 07/24/2017  . H/O chlamydia infection   . H/O trichomoniasis   . Hypertropia 08/20/2011    Past Surgical History:  Procedure Laterality Date  . EYE SURGERY  05-01-2005  [facility]   LEFT EYE  AND  1999 BILATERAL EYES 1999  . EYE SURGERY    . MEDIAN RECTUS REPAIR  08/21/2011   Procedure: MEDIAN RECTUS REPAIR;  Surgeon: Dara Hoyer, MD;  Location: [facility];  Service: Ophthalmology;  Laterality: Right;  inferior oblique myectomy  . TYMPANOSTOMY TUBE PLACEMENT  AGE 85   AND ADENOIDECTOMY  . WISDOM TOOTH EXTRACTION     ORAL SURGEON OFFICE    OB History    Gravida  2   Para  2   Term  2   Preterm  0   AB  0   Living  2     SAB  0   TAB  0   Ectopic  0   Multiple  0   Live Births  2            Home Medications    Prior to Admission medications   Medication Sig Start Date End Date Taking? Authorizing Provider  fluconazole (DIFLUCAN) 150 MG tablet Take 1 tablet (150 mg total) by mouth once a week. 06/11/19   Raylene Everts, MD  metroNIDAZOLE (FLAGYL)  500 MG tablet Take 1 tablet (500 mg total) by mouth 2 (two) times daily. 06/11/19   Raylene Everts, MD    Family History Family History  Problem Relation Age of Onset  . Cancer Maternal Grandmother        breast  . Hypertension Other   . Healthy Mother   . Healthy Father     Social History Social History   Tobacco Use  . Smoking status: Never Smoker  . Smokeless tobacco: Never Used  Substance Use Topics  . Alcohol use: No  . Drug use: No     Allergies   Patient has no known allergies.   Review of Systems Review of Systems  Constitutional: Negative for fever.  Gastrointestinal: Negative for nausea and vomiting.  Genitourinary: Positive for vaginal discharge. Negative for dysuria, flank pain, frequency, pelvic pain, vaginal bleeding and vaginal pain.     Physical Exam Triage Vital Signs ED Triage Vitals  Enc Vitals Group     BP 06/11/19 1119 110/87     Pulse Rate 06/11/19 1119 80     Resp  06/11/19 1119 18     Temp 06/11/19 1119 98.4 F (36.9 C)     Temp Source 06/11/19 1119 Oral     SpO2 06/11/19 1119 100 %     Weight --      Height --      Head Circumference --      Peak Flow --      Pain Score 06/11/19 1117 0     Pain Loc --      Pain Edu? --      Excl. in GC? --    No data found.  Updated Vital Signs BP 110/87 (BP Location: Right Arm)   Pulse 80   Temp 98.4 F (36.9 C) (Oral)   Resp 18   LMP 05/22/2019 (Approximate)   SpO2 100%     Physical Exam Constitutional:      General: She is not in acute distress.    Appearance: She is well-developed. She is obese. She is not ill-appearing.  HENT:     Head: Normocephalic and atraumatic.     Mouth/Throat:     Comments: Mask is in place Eyes:     Conjunctiva/sclera: Conjunctivae normal.     Pupils: Pupils are equal, round, and reactive to light.  Cardiovascular:     Rate and Rhythm: Normal rate.  Pulmonary:     Effort: Pulmonary effort is normal. No respiratory distress.  Abdominal:      General: There is no distension.     Palpations: Abdomen is soft.  Genitourinary:    Comments: Instructed patient on proper self swab Musculoskeletal:        General: Normal range of motion.     Cervical back: Normal range of motion.  Skin:    General: Skin is warm and dry.  Neurological:     Mental Status: She is alert.  Psychiatric:        Mood and Affect: Mood normal.        Behavior: Behavior normal.      UC Treatments / Results  Labs (all labs ordered are listed, but only abnormal results are displayed) Labs Reviewed  POCT URINALYSIS DIP (DEVICE) - Abnormal; Notable for the following components:      Result Value   Leukocytes,Ua TRACE (*)    All other components within normal limits  POC URINE PREG, ED  CERVICOVAGINAL ANCILLARY ONLY   Pregnancy test is negative EKG   Radiology No results found.  Procedures Procedures (including critical care time)  Medications Ordered in UC Medications - No data to display  Initial Impression / Assessment and Plan / UC Course  I have reviewed the triage vital signs and the nursing notes.  Pertinent labs & imaging results that were available during my care of the patient were reviewed by me and considered in my medical decision making (see chart for details).     Safe sex is discussed/recommended Final Clinical Impressions(s) / UC Diagnoses   Final diagnoses:  Acute vaginitis  Possible exposure to STD     Discharge Instructions     I am treating you for the vaginal discharge Take metronidazole 2 times a day for 1 week Take Diflucan as directed, 1 pill now and repeat in 1 week Avoid sexual encounters for 1 week Your test results will be available on MyChart in a few days You will be called if any of your tests are positive    ED Prescriptions    Medication Sig Dispense Auth. Provider   fluconazole (DIFLUCAN)  150 MG tablet Take 1 tablet (150 mg total) by mouth once a week. 2 tablet Eustace Moore, MD    metroNIDAZOLE (FLAGYL) 500 MG tablet Take 1 tablet (500 mg total) by mouth 2 (two) times daily. 14 tablet Eustace Moore, MD     PDMP not reviewed this encounter.   Eustace Moore, MD 06/11/19 [phone]

## 2019-06-11 NOTE — Discharge Instructions (Signed)
I am treating you for the vaginal discharge Take metronidazole 2 times a day for 1 week Take Diflucan as directed, 1 pill now and repeat in 1 week Avoid sexual encounters for 1 week Your test results will be available on MyChart in a few days You will be called if any of your tests are positive

## 2019-06-11 NOTE — ED Triage Notes (Signed)
Pt c/o vaginal discharge for approx 2-3 days. Denies abdom pain, n/v/d, fever, chills.

## 2019-06-14 LAB — CERVICOVAGINAL ANCILLARY ONLY
Chlamydia: POSITIVE — AB
Comment: NEGATIVE
Comment: NEGATIVE
Comment: NORMAL
Neisseria Gonorrhea: NEGATIVE
Trichomonas: NEGATIVE

## 2019-06-15 ENCOUNTER — Telehealth (HOSPITAL_COMMUNITY): Payer: Self-pay | Admitting: Orthopedic Surgery

## 2019-06-15 MED ORDER — AZITHROMYCIN 250 MG PO TABS: 1000.0000 mg | ORAL_TABLET | Freq: Once | ORAL | 0 refills | Status: AC

## 2019-06-23 ENCOUNTER — Other Ambulatory Visit: Payer: Self-pay

## 2019-06-23 ENCOUNTER — Emergency Department (HOSPITAL_COMMUNITY)
Admission: EM | Admit: 2019-06-23 | Discharge: 2019-06-23 | Disposition: A | Discharge status: Home / Self Care | Payer: Medicaid Other | Attending: Emergency Medicine | Admitting: Emergency Medicine

## 2019-06-23 DIAGNOSIS — Z5321 Procedure and treatment not carried out due to patient leaving prior to being seen by health care provider: Secondary | ICD-10-CM | POA: Diagnosis not present

## 2019-06-23 NOTE — ED Triage Notes (Signed)
Arrived POV. Patient states she was treated for STD last week and wanted to get rechescked to see if STD is gone.

## 2019-10-01 ENCOUNTER — Other Ambulatory Visit: Payer: Self-pay

## 2019-10-01 ENCOUNTER — Encounter (HOSPITAL_COMMUNITY): Payer: Self-pay

## 2019-10-01 ENCOUNTER — Ambulatory Visit (HOSPITAL_COMMUNITY)
Admission: EM | Admit: 2019-10-01 | Discharge: 2019-10-01 | Disposition: A | Discharge status: Home / Self Care | Payer: Medicaid Other | Attending: Urgent Care | Admitting: Urgent Care

## 2019-10-01 DIAGNOSIS — B373 Candidiasis of vulva and vagina: Secondary | ICD-10-CM | POA: Diagnosis present

## 2019-10-01 DIAGNOSIS — B3731 Acute candidiasis of vulva and vagina: Secondary | ICD-10-CM

## 2019-10-01 MED ORDER — FLUCONAZOLE 150 MG PO TABS: 150.0000 mg | ORAL_TABLET | Freq: Once | ORAL | 0 refills | Status: AC

## 2019-10-01 MED ORDER — FLUCONAZOLE 150 MG PO TABS: 150.0000 mg | ORAL_TABLET | Freq: Once | ORAL | 0 refills | Status: DC

## 2019-10-01 NOTE — Discharge Instructions (Addendum)
We will call you if your treatment plan needs to be changed based on your pending tests.   Take medication as prescribed.

## 2019-10-01 NOTE — ED Provider Notes (Signed)
[facility]    CSN: [id number] Arrival date & time: 10/01/19  1924      History   Chief Complaint Chief Complaint  Patient presents with  . vaginal itching/discharg    HPI Kristi Novak is a 24 y.o. female.   Pt complains of vaginal discharge and itching that started yesterday.  Denies odor, dysuria, abdominal pain, fever, chills, n/v/d.  She has tried nothing for the sx.  She also requests sti testing.       Past Medical History:  Diagnosis Date  . H/O chlamydia infection   . H/O trichomoniasis   . Hearing loss   . 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 Hypertropia RIGHT EYE  . 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 Acid reflux 07/24/2017  . H/O chlamydia infection   . H/O trichomoniasis   . Hypertropia 08/20/2011    Past Surgical History:  Procedure Laterality Date  . EYE SURGERY  05-01-2005  [facility]   LEFT EYE  AND  1999 BILATERAL EYES 1999  . EYE SURGERY    . MEDIAN RECTUS REPAIR  08/21/2011   Procedure: MEDIAN RECTUS REPAIR;  Surgeon: Corinda Gubler, MD;  Location: [facility];  Service: Ophthalmology;  Laterality: Right;  inferior oblique myectomy  . TYMPANOSTOMY TUBE PLACEMENT  AGE 25   AND ADENOIDECTOMY  . WISDOM TOOTH EXTRACTION     ORAL SURGEON OFFICE    OB History    Gravida  2   Para  2   Term  2   Preterm  0   AB  0   Living  2     SAB  0   TAB  0   Ectopic  0   Multiple  0   Live Births  2            Home Medications    Prior to Admission medications   Medication Sig Start Date End Date Taking? Authorizing Provider  fluconazole (DIFLUCAN) 150 MG tablet Take 1 tablet (150 mg total) by mouth once for 1 dose. Take first dose today, if you are still experiencing symptoms after 72 hours may take the second tablet. 10/01/19 10/01/19  Jodell Cipro, PA-C  metroNIDAZOLE (FLAGYL) 500 MG tablet  Take 1 tablet (500 mg total) by mouth 2 (two) times daily. 06/11/19   Eustace Moore, MD    Family History Family History  Problem Relation Age of Onset  . Cancer Maternal Grandmother        breast  . Hypertension Other   . Healthy Mother   . Healthy Father     Social History Social History   Tobacco Use  . Smoking status: Never Smoker  . Smokeless tobacco: Never Used  Vaping Use  . Vaping Use: Never used  Substance Use Topics  . Alcohol use: No  . Drug use: No     Allergies   Patient has no known allergies.   Review of Systems Review of Systems  Constitutional: Negative for chills and fever.  HENT: Negative for ear pain and sore throat.   Eyes: Negative for pain and visual disturbance.  Respiratory: Negative for cough and shortness of breath.   Cardiovascular: Negative for chest pain and palpitations.  Gastrointestinal: Negative for abdominal pain and vomiting.  Genitourinary: Positive for vaginal discharge. Negative for dysuria, flank pain,  frequency, hematuria, urgency, vaginal bleeding and vaginal pain.  Musculoskeletal: Negative for arthralgias and back pain.  Skin: Negative for color change and rash.  Neurological: Negative for seizures and syncope.  All other systems reviewed and are negative.    Physical Exam Triage Vital Signs ED Triage Vitals  Enc Vitals Group     BP 10/01/19 2025 126/87     Pulse Rate 10/01/19 2025 74     Resp 10/01/19 2025 16     Temp 10/01/19 2025 98 F (36.7 C)     Temp Source 10/01/19 2025 Oral     SpO2 10/01/19 2025 100 %     Weight 10/01/19 2026 220 lb (99.8 kg)     Height 10/01/19 2026 5\' 4"  (1.626 m)     Head Circumference --      Peak Flow --      Pain Score 10/01/19 2026 0     Pain Loc --      Pain Edu? --      Excl. in GC? --    No data found.  Updated Vital Signs BP 126/87   Pulse 74   Temp 98 F (36.7 C) (Oral)   Resp 16   Ht 5\' 4"  (1.626 m)   Wt 220 lb (99.8 kg)   SpO2 100%   BMI 37.76 kg/m    Visual Acuity Right Eye Distance:   Left Eye Distance:   Bilateral Distance:    Right Eye Near:   Left Eye Near:    Bilateral Near:     Physical Exam Vitals and nursing note reviewed.  Constitutional:      General: She is not in acute distress.    Appearance: She is well-developed.  HENT:     Head: Normocephalic and atraumatic.  Eyes:     Conjunctiva/sclera: Conjunctivae normal.  Cardiovascular:     Rate and Rhythm: Normal rate and regular rhythm.     Heart sounds: No murmur heard.   Pulmonary:     Effort: Pulmonary effort is normal. No respiratory distress.     Breath sounds: Normal breath sounds.  Abdominal:     Palpations: Abdomen is soft.     Tenderness: There is no abdominal tenderness.  Musculoskeletal:     Cervical back: Neck supple.  Skin:    General: Skin is warm and dry.  Neurological:     Mental Status: She is alert.      UC Treatments / Results  Labs (all labs ordered are listed, but only abnormal results are displayed) Labs Reviewed  CERVICOVAGINAL ANCILLARY ONLY    EKG   Radiology No results found.  Procedures Procedures (including critical care time)  Medications Ordered in UC Medications - No data to display  Initial Impression / Assessment and Plan / UC Course  I have reviewed the triage vital signs and the nursing notes.  Pertinent labs & imaging results that were available during my care of the patient were reviewed by me and considered in my medical decision making (see chart for details).     Will treat for vaginal candida, diflucan sent.  Gonorrhea, chlamydia, yeast vs. BV test results pending.  Will change treatment course if needed.  Return precautions discussed.  Final Clinical Impressions(s) / UC Diagnoses   Final diagnoses:  Vaginal yeast infection     Discharge Instructions     We will call you if your treatment plan needs to be changed based on your pending tests.   Take medication as prescribed.  ED  Prescriptions    Medication Sig Dispense Auth. Provider   fluconazole (DIFLUCAN) 150 MG tablet Take 1 tablet (150 mg total) by mouth once for 1 dose. Take first dose today, if you are still experiencing symptoms after 72 hours may take the second tablet. 2 tablet Jodell Cipro, PA-C     PDMP not reviewed this encounter.   Jodell Cipro, PA-C 10/01/19 2100

## 2019-10-01 NOTE — ED Triage Notes (Signed)
Pt c/o white vaginal discharge and itching started yesterday. Pt states she wants STI testing.

## 2019-10-04 LAB — CERVICOVAGINAL ANCILLARY ONLY
Bacterial Vaginitis (gardnerella): POSITIVE — AB
Candida Glabrata: NEGATIVE
Candida Vaginitis: NEGATIVE
Chlamydia: NEGATIVE
Comment: NEGATIVE
Comment: NEGATIVE
Comment: NEGATIVE
Comment: NEGATIVE
Comment: NEGATIVE
Comment: NORMAL
Neisseria Gonorrhea: NEGATIVE
Trichomonas: POSITIVE — AB

## 2019-10-05 ENCOUNTER — Telehealth (HOSPITAL_COMMUNITY): Payer: Self-pay | Admitting: Emergency Medicine

## 2019-10-05 MED ORDER — METRONIDAZOLE 500 MG PO TABS: 500.0000 mg | ORAL_TABLET | Freq: Two times a day (BID) | ORAL | 0 refills | Status: DC

## 2020-01-02 IMAGING — US US OB < 14 WEEKS - US OB TV
1 series · 15 of 28 positions shown · non-contrast
Comparison: None.

CLINICAL DATA: Abdominal pain for several days. Gestational age by
LMP of 7 weeks 0 days.

EXAM:
OBSTETRIC <14 WK US AND TRANSVAGINAL OB US
TECHNIQUE: Both transabdominal and transvaginal ultrasound examinations were
performed for complete evaluation of the gestation as well as the
maternal uterus, adnexal regions, and pelvic cul-de-sac.
Transvaginal technique was performed to assess early pregnancy.

[Series 1: us ob < 14 weeks - us ob tv · 15 of 49 slices shown]
[im 1/49]
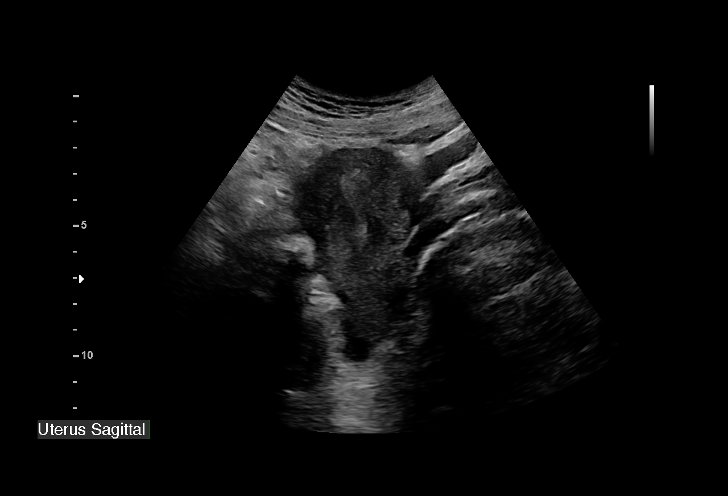
[im 4/49]
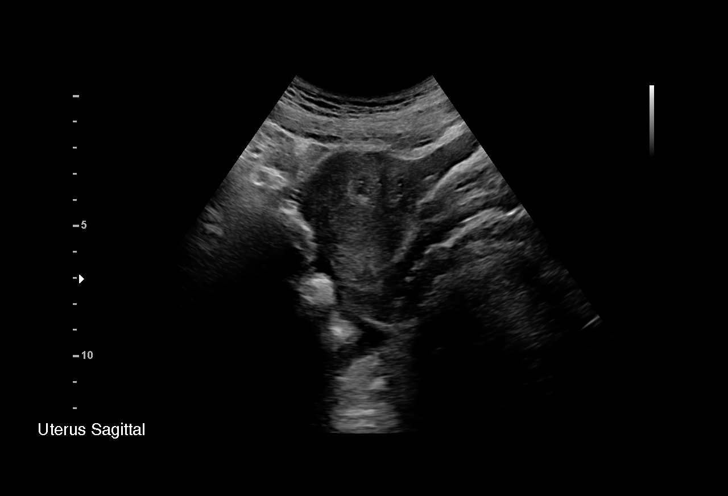
[im 8/49]
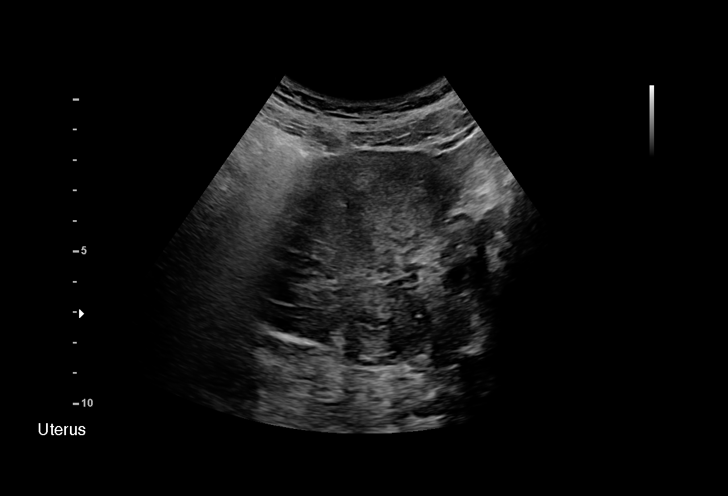
[im 11/49]
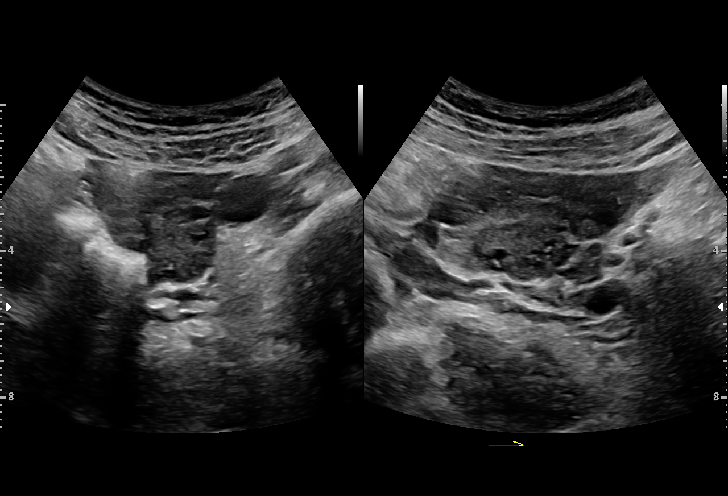
[im 15/49]
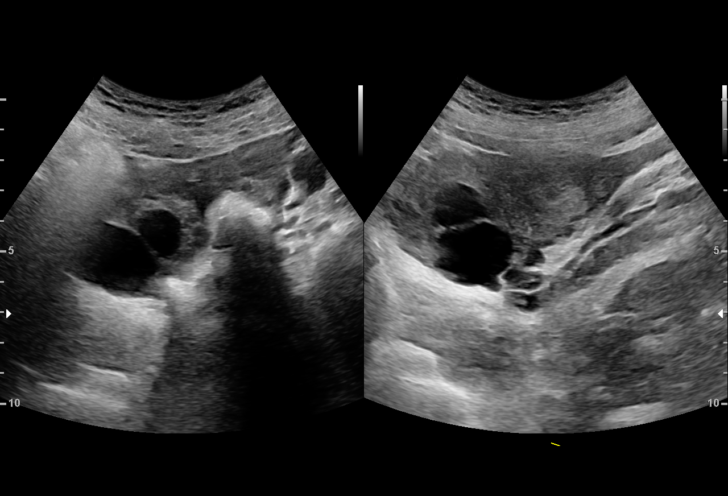
[im 18/49]
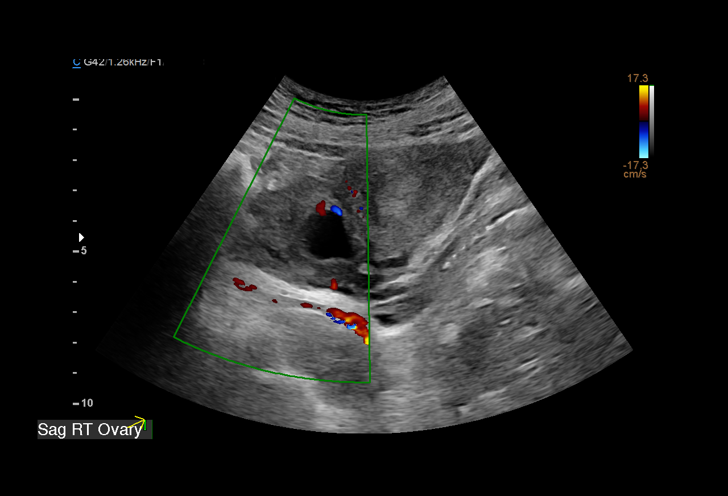
[im 22/49]
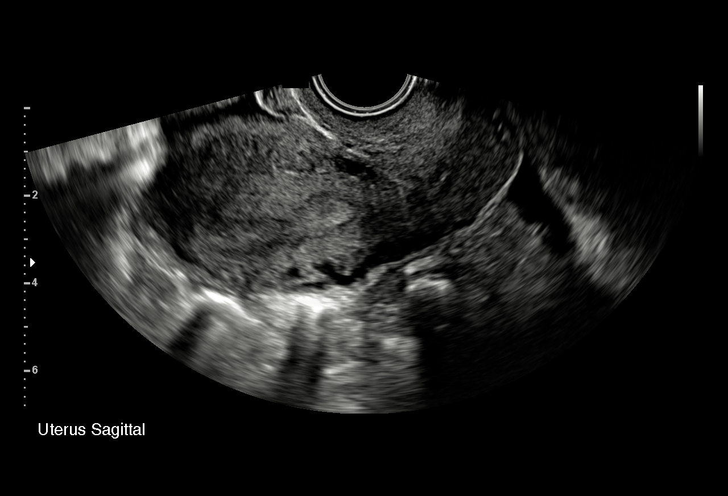
[im 25/49]
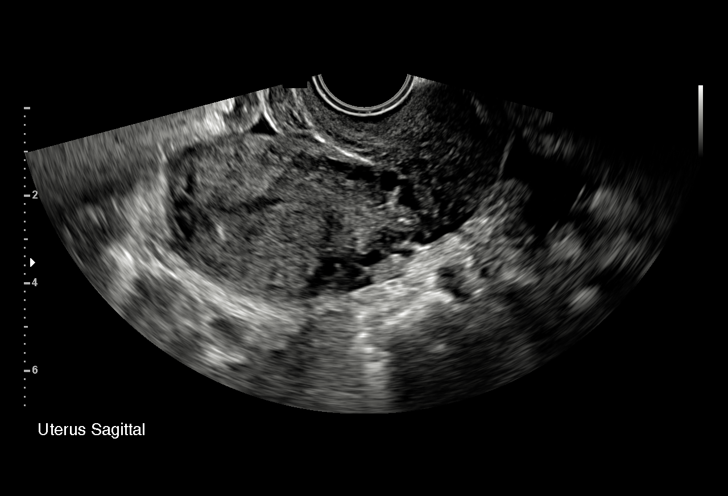
[im 27/49]
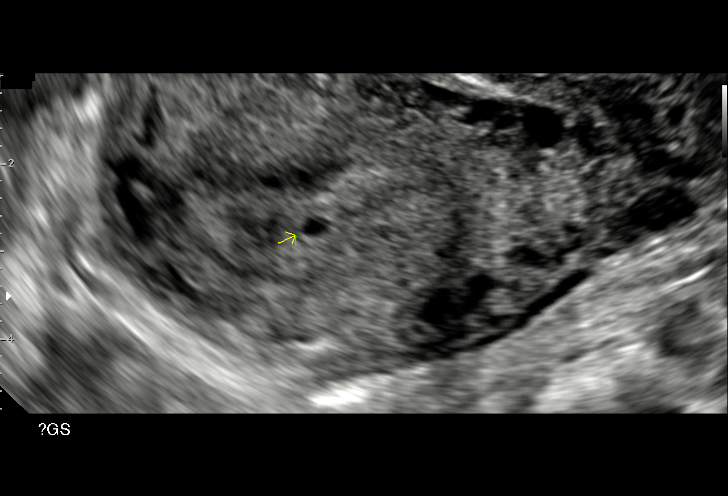
[im 31/49]
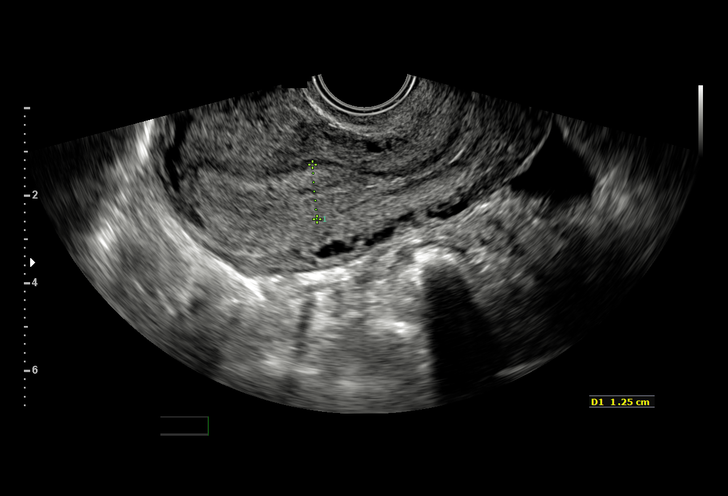
[im 34/49]
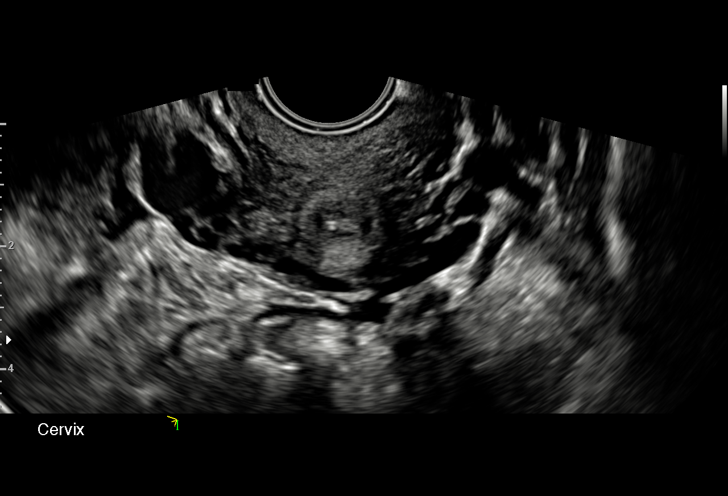
[im 38/49]
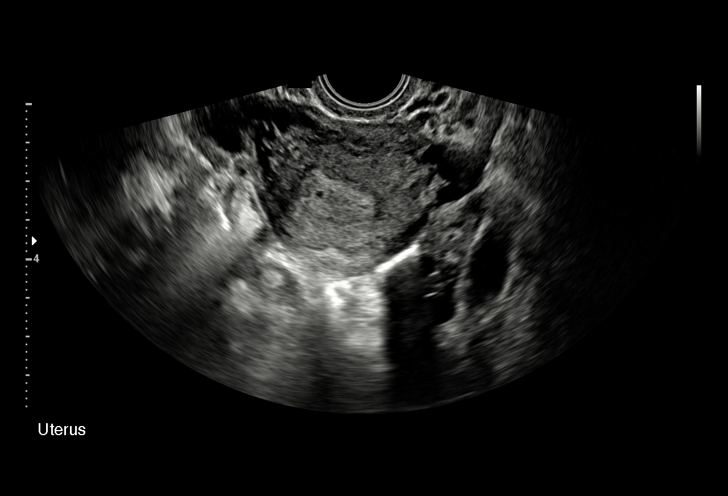
[im 41/49]
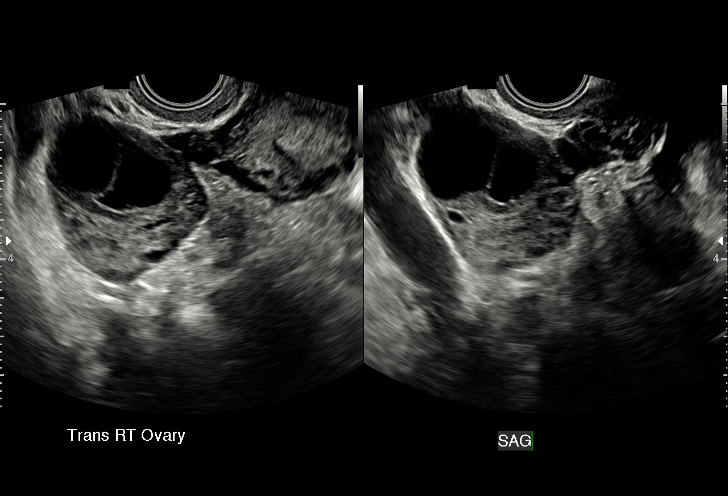
[im 45/49]
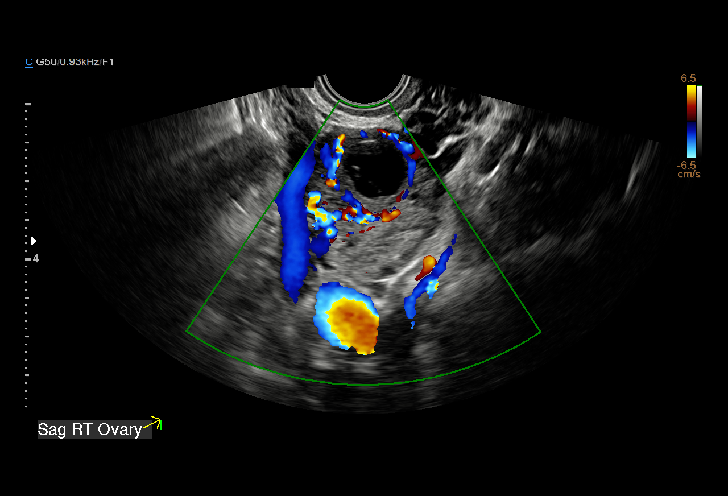
[im 49/49]
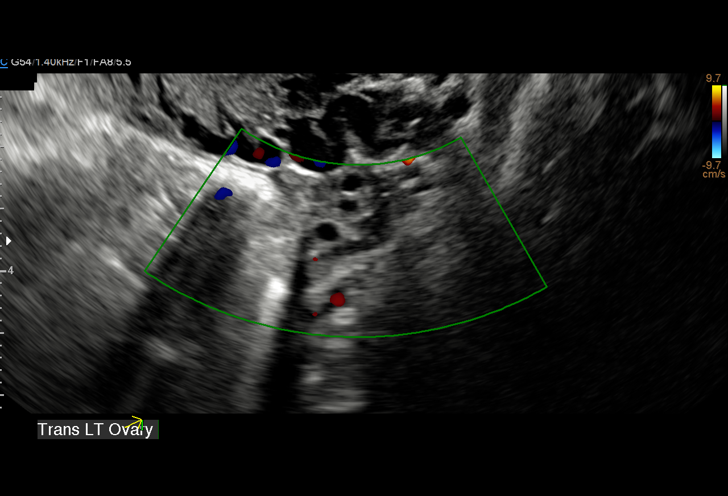

[15 of 28 positions shown; findings below may reference images not displayed]

FINDINGS: Intrauterine gestational sac: Probable single early IUGS with double
decidual sac sign

Yolk sac:  Not Visualized.

Embryo:  Not Visualized.

MSD: 3 mm   4 w   6 d

Subchorionic hemorrhage:  None visualized.

Maternal uterus/adnexae: 2.6 cm right ovarian corpus luteum cyst.
Normal appearance of left ovary. No suspicious adnexal mass or
abnormal free fluid identified.
IMPRESSION: Probable single early approximately 5 week intrauterine gestational
sac. Consider following serial b-hCG levels, with followup
ultrasound to assess viability in 10-14 days.

No suspicious adnexal mass or abnormal free fluid identified.

## 2020-01-19 ENCOUNTER — Encounter (HOSPITAL_COMMUNITY): Payer: Self-pay | Admitting: *Deleted

## 2020-01-19 ENCOUNTER — Other Ambulatory Visit: Payer: Self-pay

## 2020-01-19 ENCOUNTER — Ambulatory Visit (HOSPITAL_COMMUNITY)
Admission: EM | Admit: 2020-01-19 | Discharge: 2020-01-19 | Disposition: A | Discharge status: Home / Self Care | Payer: Medicaid Other | Attending: Family Medicine | Admitting: Family Medicine

## 2020-01-19 DIAGNOSIS — A5901 Trichomonal vulvovaginitis: Secondary | ICD-10-CM | POA: Diagnosis not present

## 2020-01-19 DIAGNOSIS — A5402 Gonococcal vulvovaginitis, unspecified: Secondary | ICD-10-CM | POA: Diagnosis not present

## 2020-01-19 DIAGNOSIS — N939 Abnormal uterine and vaginal bleeding, unspecified: Secondary | ICD-10-CM | POA: Insufficient documentation

## 2020-01-19 DIAGNOSIS — B373 Candidiasis of vulva and vagina: Secondary | ICD-10-CM | POA: Diagnosis not present

## 2020-01-19 LAB — POC URINE PREG, ED: Preg Test, Ur: NEGATIVE

## 2020-01-19 NOTE — Discharge Instructions (Signed)
We will notify of you any positive findings or if any changes to treatment are needed. If normal or otherwise without concern to your results, we will not call you. Please log on to your MyChart to review your results if interested in so.   Don't have sex until we have treated any positive findings.  Use condoms to prevent infections.

## 2020-01-19 NOTE — ED Provider Notes (Signed)
[facility]    CSN: [id number] Arrival date & time: 01/19/20  1624      History   Chief Complaint Chief Complaint  Patient presents with  . Exposure to STD    HPI Kristi Novak is a 25 y.o. female.   [name] presents with complaints of vaginal spotting which started two days ago. No pelvic pain. No other vaginal discharge. She is concerned about potential std. Has two partners, doesn't regularly use condoms. Has had std in the past. LMP was 12/25. No urinary symptoms. No back pain. Periods have otherwise been normal.    ROS per HPI, negative if not otherwise mentioned.      Past Medical History:  Diagnosis Date  . H/O chlamydia infection   . H/O trichomoniasis   . Hearing loss   . 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 Hypertropia RIGHT EYE  . 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 Acid reflux 07/24/2017  . H/O chlamydia infection   . H/O trichomoniasis   . Hypertropia 08/20/2011    Past Surgical History:  Procedure Laterality Date  . EYE SURGERY  05-01-2005  [facility]   LEFT EYE  AND  1999 BILATERAL EYES 1999  . EYE SURGERY    . MEDIAN RECTUS REPAIR  08/21/2011   Procedure: MEDIAN RECTUS REPAIR;  Surgeon: Corinda Gubler, MD;  Location: [facility];  Service: Ophthalmology;  Laterality: Right;  inferior oblique myectomy  . TYMPANOSTOMY TUBE PLACEMENT  AGE 45   AND ADENOIDECTOMY  . WISDOM TOOTH EXTRACTION     ORAL SURGEON OFFICE    OB History    Gravida  2   Para  2   Term  2   Preterm  0   AB  0   Living  2     SAB  0   IAB  0   Ectopic  0   Multiple  0   Live Births  2            Home Medications    Prior to Admission medications   Medication Sig Start Date End Date Taking? Authorizing Provider  metroNIDAZOLE (FLAGYL) 500 MG tablet Take 1 tablet (500 mg total) by mouth 2 (two) times daily.  06/11/19   Eustace Moore, MD  metroNIDAZOLE (FLAGYL) 500 MG tablet Take 1 tablet (500 mg total) by mouth 2 (two) times daily. 10/05/19   LampteyBritta Mccreedy, MD    Family History Family History  Problem Relation Age of Onset  . Cancer Maternal Grandmother        breast  . Hypertension Other   . Healthy Mother   . Healthy Father     Social History Social History   Tobacco Use  . Smoking status: Never Smoker  . Smokeless tobacco: Never Used  Vaping Use  . Vaping Use: Never used  Substance Use Topics  . Alcohol use: No  . Drug use: No     Allergies   Patient has no known allergies.   Review of Systems Review of Systems   Physical Exam Triage Vital Signs ED Triage Vitals  Enc Vitals Group     BP 01/19/20 1709 113/69     Pulse Rate 01/19/20 1709 79     Resp 01/19/20 1709 16     Temp 01/19/20 1709 98.2  F (36.8 C)     Temp Source 01/19/20 1709 Oral     SpO2 01/19/20 1709 100 %     Weight --      Height --      Head Circumference --      Peak Flow --      Pain Score 01/19/20 1706 0     Pain Loc --      Pain Edu? --      Excl. in GC? --    No data found.  Updated Vital Signs BP 113/69 (BP Location: Right Arm)   Pulse 79   Temp 98.2 F (36.8 C) (Oral)   Resp 16   LMP 01/01/2020   SpO2 100%   Visual Acuity Right Eye Distance:   Left Eye Distance:   Bilateral Distance:    Right Eye Near:   Left Eye Near:    Bilateral Near:     Physical Exam Constitutional:      General: She is not in acute distress.    Appearance: She is well-developed.  Cardiovascular:     Rate and Rhythm: Normal rate.  Pulmonary:     Effort: Pulmonary effort is normal.  Abdominal:     Palpations: Abdomen is not rigid.     Tenderness: There is no abdominal tenderness. There is no guarding or rebound.  Genitourinary:    Comments: Denies sores, lesions, vaginal bleeding; no pelvic pain; gu exam deferred at this time, vaginal self swab collected.   Skin:    General: Skin is  warm and dry.  Neurological:     Mental Status: She is alert and oriented to person, place, and time.      UC Treatments / Results  Labs (all labs ordered are listed, but only abnormal results are displayed) Labs Reviewed  CERVICOVAGINAL ANCILLARY ONLY    EKG   Radiology No results found.  Procedures Procedures (including critical care time)  Medications Ordered in UC Medications - No data to display  Initial Impression / Assessment and Plan / UC Course  I have reviewed the triage vital signs and the nursing notes.  Pertinent labs & imaging results that were available during my care of the patient were reviewed by me and considered in my medical decision making (see chart for details).     Vaginal cytology pending. No medications initiated at this time, pending results of resting. Safe sex encouraged. Return precautions provided. Patient verbalized understanding and agreeable to plan.   Final Clinical Impressions(s) / UC Diagnoses   Final diagnoses:  Vaginal spotting     Discharge Instructions     We will notify of you any positive findings or if any changes to treatment are needed. If normal or otherwise without concern to your results, we will not call you. Please log on to your MyChart to review your results if interested in so.   Don't have sex until we have treated any positive findings.  Use condoms to prevent infections.    ED Prescriptions    None     PDMP not reviewed this encounter.   Georgetta Haber, NP 01/19/20 1736

## 2020-01-19 NOTE — ED Triage Notes (Signed)
Vaginal DC white with blood tinged .

## 2020-01-20 LAB — CERVICOVAGINAL ANCILLARY ONLY
Bacterial Vaginitis (gardnerella): POSITIVE — AB
Candida Glabrata: NEGATIVE
Candida Vaginitis: POSITIVE — AB
Chlamydia: NEGATIVE
Comment: NEGATIVE
Comment: NEGATIVE
Comment: NEGATIVE
Comment: NEGATIVE
Comment: NEGATIVE
Comment: NORMAL
Neisseria Gonorrhea: POSITIVE — AB
Trichomonas: POSITIVE — AB

## 2020-01-21 ENCOUNTER — Other Ambulatory Visit: Payer: Self-pay

## 2020-01-21 ENCOUNTER — Telehealth: Payer: Self-pay

## 2020-01-21 ENCOUNTER — Ambulatory Visit (HOSPITAL_COMMUNITY)
Admission: EM | Admit: 2020-01-21 | Discharge: 2020-01-21 | Discharge status: Against Medical Advice | Payer: Medicaid Other | Attending: Emergency Medicine | Admitting: Emergency Medicine

## 2020-01-21 DIAGNOSIS — B9689 Other specified bacterial agents as the cause of diseases classified elsewhere: Secondary | ICD-10-CM

## 2020-01-21 DIAGNOSIS — B379 Candidiasis, unspecified: Secondary | ICD-10-CM

## 2020-01-21 DIAGNOSIS — A599 Trichomoniasis, unspecified: Secondary | ICD-10-CM

## 2020-01-21 MED ORDER — FLUCONAZOLE 150 MG PO TABS: ORAL_TABLET | ORAL | 0 refills | Status: DC

## 2020-01-21 MED ORDER — CEFTRIAXONE SODIUM 500 MG IJ SOLR
INTRAMUSCULAR | Status: AC
  Filled 2020-01-21: qty 500

## 2020-01-21 MED ORDER — METRONIDAZOLE 500 MG PO TABS: 500.0000 mg | ORAL_TABLET | Freq: Two times a day (BID) | ORAL | 0 refills | Status: DC

## 2020-01-21 MED ORDER — STERILE WATER FOR INJECTION IJ SOLN
INTRAMUSCULAR | Status: AC
  Filled 2020-01-21: qty 10

## 2020-01-21 MED ORDER — CEFTRIAXONE SODIUM 500 MG IJ SOLR: 500.0000 mg | Freq: Once | INTRAMUSCULAR | Status: DC

## 2020-01-21 NOTE — ED Notes (Signed)
Patient refused treatment both the IM rocephin and the medication sent electronically to pharmacy; all medications, and precautions were discussed with patient

## 2020-01-21 NOTE — Telephone Encounter (Signed)
Gonorrhea is positive.  Please return to the Urgent Care as soon as possible for treatment with 500mg  IM rocephin. Patient will not need to see a provider unless there are new symptoms you would like evaluated. Trichomonas was also positive and is an STD.  Please refrain from sexual intercourse for now and for 7 days after treatment to give the medicine time to work. Sexual partners need to be notified and tested/treated. Condoms may reduce risk of reinfection. [facility] notified.   Bacterial vaginosis is positive. Pt needs treatment. Flagyl 500 mg BID x 7 days #14 no refills sent to patients pharmacy of choice.  This medication with also cover the Trichomonas.   Test for candida (yeast) was positive.  Prescription for fluconazole 150mg  po now, repeat dose in 3d if needed, #2 no refills, sent to the pharmacy of record.  Recheck or followup with PCP for further evaluation if symptoms are not improving.

## 2020-02-12 ENCOUNTER — Encounter: Payer: Self-pay | Admitting: Emergency Medicine

## 2020-02-12 ENCOUNTER — Ambulatory Visit
Admission: EM | Admit: 2020-02-12 | Discharge: 2020-02-12 | Disposition: A | Discharge status: Home / Self Care | Payer: Medicaid Other | Attending: Emergency Medicine | Admitting: Emergency Medicine

## 2020-02-12 ENCOUNTER — Other Ambulatory Visit: Payer: Self-pay

## 2020-02-12 DIAGNOSIS — L02411 Cutaneous abscess of right axilla: Secondary | ICD-10-CM | POA: Diagnosis not present

## 2020-02-12 MED ORDER — DOXYCYCLINE HYCLATE 100 MG PO CAPS: 100.0000 mg | ORAL_CAPSULE | Freq: Two times a day (BID) | ORAL | 0 refills | Status: AC

## 2020-02-12 MED ORDER — IBUPROFEN 800 MG PO TABS: 800.0000 mg | ORAL_TABLET | Freq: Three times a day (TID) | ORAL | 0 refills | Status: DC

## 2020-02-12 NOTE — ED Provider Notes (Signed)
[name]    CSN: [id number] Arrival date & time: 02/12/20  1121      History   Chief Complaint Chief Complaint  Patient presents with  . Abscess  . Wound Check    Under right arm x 2 days    HPI Kristi Novak is a 25 y.o. female presenting today for evaluation of possible abscess.  Noticed a bump underneath her right arm.  Reports associated pain.  Swelling for the past 2 days.  Denies fevers.  Denies history of similar.  HPI  Past Medical History:  Diagnosis Date  . H/O chlamydia infection   . H/O trichomoniasis   . Hearing loss   . 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 Hypertropia RIGHT EYE  . 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 Acid reflux 07/24/2017  . H/O chlamydia infection   . H/O trichomoniasis   . Hypertropia 08/20/2011    Past Surgical History:  Procedure Laterality Date  . EYE SURGERY  05-01-2005  [facility]   LEFT EYE  AND  1999 BILATERAL EYES 1999  . EYE SURGERY    . MEDIAN RECTUS REPAIR  08/21/2011   Procedure: MEDIAN RECTUS REPAIR;  Surgeon: Corinda Gubler, MD;  Location: [facility];  Service: Ophthalmology;  Laterality: Right;  inferior oblique myectomy  . TYMPANOSTOMY TUBE PLACEMENT  AGE 74   AND ADENOIDECTOMY  . WISDOM TOOTH EXTRACTION     ORAL SURGEON OFFICE    OB History    Gravida  2   Para  2   Term  2   Preterm  0   AB  0   Living  2     SAB  0   IAB  0   Ectopic  0   Multiple  0   Live Births  2            Home Medications    Prior to Admission medications   Medication Sig Start Date End Date Taking? Authorizing Provider  doxycycline (VIBRAMYCIN) 100 MG capsule Take 1 capsule (100 mg total) by mouth 2 (two) times daily for 7 days. 02/12/20 02/19/20 Yes [name] C, PA-C  ibuprofen (ADVIL) 800 MG tablet Take 1 tablet (800 mg total) by mouth 3 (three) times daily. 02/12/20   Yes [name], Franklin C, PA-C    Family History Family History  Problem Relation Age of Onset  . Cancer Maternal Grandmother        breast  . Hypertension Other   . Healthy Mother   . Healthy Father     Social History Social History   Tobacco Use  . Smoking status: Never Smoker  . Smokeless tobacco: Never Used  Vaping Use  . Vaping Use: Never used  Substance Use Topics  . Alcohol use: No  . Drug use: No     Allergies   Patient has no known allergies.   Review of Systems Review of Systems  Constitutional: Negative for fatigue and fever.  HENT: Negative for mouth sores.   Eyes: Negative for visual disturbance.  Respiratory: Negative for shortness of breath.   Cardiovascular: Negative for chest pain.  Gastrointestinal: Negative for abdominal pain, nausea and vomiting.  Genitourinary: Negative for genital sores.  Musculoskeletal: Negative for arthralgias and joint swelling.  Skin: Positive for color change. Negative for rash and wound.  Neurological: Negative for dizziness, weakness, light-headedness and headaches.     Physical Exam Triage Vital Signs ED Triage Vitals  Enc Vitals Group     BP 02/12/20 1141 117/76     Pulse Rate 02/12/20 1141 79     Resp 02/12/20 1141 18     Temp 02/12/20 1141 98 F (36.7 C)     Temp Source 02/12/20 1141 Oral     SpO2 02/12/20 1141 98 %     Weight --      Height --      Head Circumference --      Peak Flow --      Pain Score 02/12/20 1144 2     Pain Loc --      Pain Edu? --      Excl. in GC? --    No data found.  Updated Vital Signs BP 117/76 (BP Location: Left Arm)   Pulse 79   Temp 98 F (36.7 C) (Oral)   Resp 18   LMP 02/04/2020 (Exact Date)   SpO2 98%   Visual Acuity Right Eye Distance:   Left Eye Distance:   Bilateral Distance:    Right Eye Near:   Left Eye Near:    Bilateral Near:     Physical Exam Vitals and nursing note reviewed.  Constitutional:      Appearance: She is well-developed and  well-nourished.     Comments: No acute distress  HENT:     Head: Normocephalic and atraumatic.     Nose: Nose normal.  Eyes:     Conjunctiva/sclera: Conjunctivae normal.  Cardiovascular:     Rate and Rhythm: Normal rate.  Pulmonary:     Effort: Pulmonary effort is normal. No respiratory distress.  Abdominal:     General: There is no distension.  Musculoskeletal:        General: Normal range of motion.     Cervical back: Neck supple.  Skin:    General: Skin is warm and dry.     Comments: Right axilla area with 1 cm area of induration without significant overlying erythema or discoloration, no fluctuance  Neurological:     Mental Status: She is alert and oriented to person, place, and time.  Psychiatric:        Mood and Affect: Mood and affect normal.      UC Treatments / Results  Labs (all labs ordered are listed, but only abnormal results are displayed) Labs Reviewed - No data to display  EKG   Radiology No results found.  Procedures Procedures (including critical care time)  Medications Ordered in UC Medications - No data to display  Initial Impression / Assessment and Plan / UC Course  I have reviewed the triage vital signs and the nursing notes.  Pertinent labs & imaging results that were available during my care of the patient were reviewed by me and considered in my medical decision making (see chart for details).     Developing abscess to right axilla, no fluctuance at this time, no superficial skin changes, initiate on doxycycline, recommend warm compresses and anti-inflammatories, patient to follow-up in 2 to 3 days if not improving with antibiotics alone as may need I&D. Discussed strict return precautions. Patient verbalized understanding and is agreeable with plan.   Final Clinical Impressions(s) / UC Diagnoses   Final diagnoses:  Abscess of right axilla     Discharge Instructions     Begin doxycycline twice daily for 1 week Warm compresses  to  area Ibuprofen and Tylenol for pain Please return in 2 to 3 days if not seeing any improvement or abscess worsening    ED Prescriptions    Medication Sig Dispense Auth. Provider   ibuprofen (ADVIL) 800 MG tablet Take 1 tablet (800 mg total) by mouth 3 (three) times daily. 21 tablet [name] C, PA-C   doxycycline (VIBRAMYCIN) 100 MG capsule Take 1 capsule (100 mg total) by mouth 2 (two) times daily for 7 days. 14 capsule Tari Lecount, [place] C, PA-C     PDMP not reviewed this encounter.   Lew Dawes, PA-C 02/12/20 1255

## 2020-02-12 NOTE — ED Triage Notes (Signed)
Patient states she noticed a bump under her right arm 2 days ago. Pt states the bump is painful to touch. Pt is aox4 and ambulatory.

## 2020-02-12 NOTE — Discharge Instructions (Addendum)
Begin doxycycline twice daily for 1 week Warm compresses to area Ibuprofen and Tylenol for pain Please return in 2 to 3 days if not seeing any improvement or abscess worsening

## 2020-07-31 ENCOUNTER — Other Ambulatory Visit: Payer: Self-pay

## 2020-07-31 ENCOUNTER — Ambulatory Visit
Admission: EM | Admit: 2020-07-31 | Discharge: 2020-07-31 | Disposition: A | Discharge status: Home / Self Care | Payer: Medicaid Other | Attending: Family Medicine | Admitting: Family Medicine

## 2020-07-31 DIAGNOSIS — Z3201 Encounter for pregnancy test, result positive: Secondary | ICD-10-CM | POA: Insufficient documentation

## 2020-07-31 DIAGNOSIS — Z113 Encounter for screening for infections with a predominantly sexual mode of transmission: Secondary | ICD-10-CM | POA: Diagnosis not present

## 2020-07-31 LAB — POCT URINE PREGNANCY: Preg Test, Ur: POSITIVE — AB

## 2020-07-31 NOTE — ED Provider Notes (Signed)
[name]    CSN: [id number] Arrival date & time: 07/31/20  5726      History   Chief Complaint Chief Complaint  Patient presents with   SEXUALLY TRANSMITTED DISEASE    Screening     HPI Kristi Novak is a 25 y.o. female.   Patient presenting today requesting STD screening.  She states she is asymptomatic, no pelvic or abdominal pain, nausea, vomiting, fevers, flank pain, dysuria, vaginal discharge or rashes.  No known exposures to any STIs.  Does have a history of multiple different types of STDs.  Does not recall her last menstrual period but thinks it was over a month ago.  Has irregular periods historically.  Has had unprotected sex several times in the past month or so.   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    Hypertropia RIGHT EYE   Seizures (HCC) 2007   Idiopathic   had two seizures (none since)    Patient Active Problem List   Diagnosis Date Noted   Acid reflux 07/24/2017   H/O chlamydia infection    H/O trichomoniasis    Hypertropia 08/20/2011    Past Surgical History:  Procedure Laterality Date   EYE SURGERY  05-01-2005  [facility]   LEFT EYE  AND  1999 BILATERAL EYES 1999   EYE SURGERY     MEDIAN RECTUS REPAIR  08/21/2011   Procedure: MEDIAN RECTUS REPAIR;  Surgeon: Corinda Gubler, MD;  Location: [facility];  Service: Ophthalmology;  Laterality: Right;  inferior oblique myectomy   TYMPANOSTOMY TUBE PLACEMENT  AGE 54   AND ADENOIDECTOMY   WISDOM TOOTH EXTRACTION     ORAL SURGEON OFFICE    OB History     Gravida  2   Para  2   Term  2   Preterm  0   AB  0   Living  2      SAB  0   IAB  0   Ectopic  0   Multiple  0   Live Births  2            Home Medications    Prior to Admission medications   Medication Sig Start Date End Date Taking? Authorizing Provider  ibuprofen  (ADVIL) 800 MG tablet Take 1 tablet (800 mg total) by mouth 3 (three) times daily. 02/12/20   Wieters, Junius Creamer, PA-C    Family History Family History  Problem Relation Age of Onset   Cancer Maternal Grandmother        breast   Hypertension Other    Healthy Mother    Healthy Father     Social History Social History   Tobacco Use   Smoking status: Never   Smokeless tobacco: Never  Vaping Use   Vaping Use: Never used  Substance Use Topics   Alcohol use: No   Drug use: No     Allergies   Patient has no known allergies.   Review of Systems Review of Systems Per HPI  Physical Exam Triage Vital Signs ED Triage Vitals  Enc Vitals Group     BP 07/31/20 1227 135/73     Pulse Rate 07/31/20 1227 69     Resp 07/31/20 1227 18     Temp 07/31/20 1227 98.9 F (37.2 C)     Temp  Source 07/31/20 1227 Oral     SpO2 07/31/20 1227 100 %     Weight --      Height --      Head Circumference --      Peak Flow --      Pain Score 07/31/20 1226 0     Pain Loc --      Pain Edu? --      Excl. in GC? --    No data found.  Updated Vital Signs BP 135/73 (BP Location: Left Arm)   Pulse 69   Temp 98.9 F (37.2 C) (Oral)   Resp 18   SpO2 100%   Visual Acuity Right Eye Distance:   Left Eye Distance:   Bilateral Distance:    Right Eye Near:   Left Eye Near:    Bilateral Near:     Physical Exam Vitals and nursing note reviewed.  Constitutional:      Appearance: Normal appearance. She is not ill-appearing.  HENT:     Head: Atraumatic.     Mouth/Throat:     Mouth: Mucous membranes are moist.  Eyes:     Extraocular Movements: Extraocular movements intact.     Conjunctiva/sclera: Conjunctivae normal.  Cardiovascular:     Rate and Rhythm: Normal rate and regular rhythm.     Heart sounds: Normal heart sounds.  Pulmonary:     Effort: Pulmonary effort is normal.     Breath sounds: Normal breath sounds.  Abdominal:     General: Bowel sounds are normal. There is no  distension.     Palpations: Abdomen is soft.     Tenderness: There is no abdominal tenderness. There is no guarding.  Genitourinary:    Comments: GU exam deferred, self swab performed Musculoskeletal:        General: Normal range of motion.     Cervical back: Normal range of motion and neck supple.  Skin:    General: Skin is warm and dry.  Neurological:     Mental Status: She is alert and oriented to person, place, and time.  Psychiatric:        Mood and Affect: Mood normal.        Thought Content: Thought content normal.        Judgment: Judgment normal.   UC Treatments / Results  Labs (all labs ordered are listed, but only abnormal results are displayed) Labs Reviewed  POCT URINE PREGNANCY - Abnormal; Notable for the following components:      Result Value   Preg Test, Ur Positive (*)    All other components within normal limits  CERVICOVAGINAL ANCILLARY ONLY    EKG   Radiology No results found.  Procedures Procedures (including critical care time)  Medications Ordered in UC Medications - No data to display  Initial Impression / Assessment and Plan / UC Course  I have reviewed the triage vital signs and the nursing notes.  Pertinent labs & imaging results that were available during my care of the patient were reviewed by me and considered in my medical decision making (see chart for details).     Vitals and exam very reassuring today.  Urine pregnancy was positive, reviewed this information with patient who was shocked and very upset.  She does not currently have an OB/GYN to follow-up with, resources given for this.  Vaginal swab pending for rule out of infections.  Follow-up if have any new or worsening symptoms.  Safe sexual practices reviewed.  Final Clinical Impressions(s) /  UC Diagnoses   Final diagnoses:  Positive pregnancy test  Screening for STD (sexually transmitted disease)   Discharge Instructions   None    ED Prescriptions   None    PDMP  not reviewed this encounter.   Particia Nearing, [place] 07/31/20 1937

## 2020-07-31 NOTE — ED Triage Notes (Signed)
Pt came in to be checked for STD screening, pt denies having any symptoms.

## 2020-08-01 ENCOUNTER — Telehealth (HOSPITAL_COMMUNITY): Payer: Self-pay | Admitting: Emergency Medicine

## 2020-08-01 LAB — CERVICOVAGINAL ANCILLARY ONLY
Bacterial Vaginitis (gardnerella): POSITIVE — AB
Candida Glabrata: NEGATIVE
Candida Vaginitis: POSITIVE — AB
Chlamydia: NEGATIVE
Comment: NEGATIVE
Comment: NEGATIVE
Comment: NEGATIVE
Comment: NEGATIVE
Comment: NEGATIVE
Comment: NORMAL
Neisseria Gonorrhea: NEGATIVE
Trichomonas: NEGATIVE

## 2020-08-01 MED ORDER — FLUCONAZOLE 150 MG PO TABS: 150.0000 mg | ORAL_TABLET | Freq: Once | ORAL | 0 refills | Status: AC

## 2020-08-01 MED ORDER — METRONIDAZOLE 0.75 % VA GEL: 1.0000 | Freq: Every day | VAGINAL | 0 refills | Status: AC

## 2020-08-16 ENCOUNTER — Ambulatory Visit: Payer: Medicaid Other

## 2020-08-16 ENCOUNTER — Ambulatory Visit (INDEPENDENT_AMBULATORY_CARE_PROVIDER_SITE_OTHER): Payer: Medicaid Other

## 2020-08-16 ENCOUNTER — Other Ambulatory Visit: Payer: Self-pay

## 2020-08-16 VITALS — BP 127/83 | HR 82 | Ht 66.0 in | Wt 231.1 lb

## 2020-08-16 DIAGNOSIS — O3680X0 Pregnancy with inconclusive fetal viability, not applicable or unspecified: Secondary | ICD-10-CM

## 2020-08-16 DIAGNOSIS — Z3A01 Less than 8 weeks gestation of pregnancy: Secondary | ICD-10-CM

## 2020-08-16 DIAGNOSIS — Z3481 Encounter for supervision of other normal pregnancy, first trimester: Secondary | ICD-10-CM | POA: Diagnosis not present

## 2020-08-16 DIAGNOSIS — Z3491 Encounter for supervision of normal pregnancy, unspecified, first trimester: Secondary | ICD-10-CM | POA: Insufficient documentation

## 2020-08-16 DIAGNOSIS — Z3492 Encounter for supervision of normal pregnancy, unspecified, second trimester: Secondary | ICD-10-CM | POA: Insufficient documentation

## 2020-08-16 MED ORDER — VITAFOL GUMMIES 3.33-0.333-34.8 MG PO CHEW: 3.0000 | CHEWABLE_TABLET | Freq: Every day | ORAL | 11 refills | Status: AC

## 2020-08-16 MED ORDER — GOJJI WEIGHT SCALE MISC: 1.0000 | 0 refills | Status: DC

## 2020-08-16 MED ORDER — BLOOD PRESSURE KIT DEVI: 1.0000 | 0 refills | Status: DC

## 2020-08-16 NOTE — Progress Notes (Signed)
New OB Intake  I connected with  Kristi Novak on 08/16/20 at  3:15 PM EDT by in person. Video Visit and verified that I am speaking with the correct person using two identifiers. Nurse is located at [facility] and pt is located at [place].  I discussed the limitations, risks, security and privacy concerns of performing an evaluation and management service by telephone and the availability of in person appointments. I also discussed with the patient that there may be a patient responsible charge related to this service. The patient expressed understanding and agreed to proceed.  I explained I am completing New OB Intake today. We discussed her EDD of 04/06/21 that is based on early u/s. Pt is G3/P2002. I reviewed her allergies, medications, Medical/Surgical/OB history, and appropriate screenings. I informed her of [facility] services. Based on history, this is a/an  pregnancy uncomplicated .   Patient Active Problem List   Diagnosis Date Noted   Acid reflux 07/24/2017   H/O chlamydia infection    H/O trichomoniasis    Hypertropia 08/20/2011    Concerns addressed today  Delivery Plans:  Plans to deliver at [facility].   MyChart/Babyscripts MyChart access verified. I explained pt will have some visits in office and some virtually. Babyscripts instructions given and order placed. Patient verifies receipt of registration text/e-mail. Account successfully created and app downloaded.  Blood Pressure Cuff  Blood pressure cuff ordered for patient to pick-up from [employer]. Explained after first prenatal appt pt will check weekly and document in Babyscripts.  Weight scale: Patient does not have weight scale. Weight scale ordered for patient to pick up form [facility].   Anatomy US Explained first scheduled [place] will be around 19 weeks. Dating and viability scan performed today  Labs Discussed Avelina Laine genetic screening with patient. Would like both Panorama and Horizon drawn at new OB visit.  Routine prenatal labs needed.  Covid Vaccine Patient has not covid vaccine.   Mother/ Baby Dyad Candidate?    If yes, offer as possibility  Informed patient of [facility] website  and placed link in her AVS.   Social Determinants of Health Food Insecurity: Patient denies food insecurity. WIC Referral: Patient is interested in referral to [facility].  Transportation: Patient denies transportation needs. Childcare: Discussed no children allowed at ultrasound appointments. Offered childcare services; patient declines childcare services at this time.   Placed OB Box on problem list and updated  First visit review I reviewed new OB appt with pt. I explained she will have a pelvic exam, ob bloodwork with genetic screening, and PAP smear. Explained pt will be seen by Nettie Elm at first visit; encounter routed to appropriate provider. Explained that patient will be seen by pregnancy navigator following visit with provider. [facility] information placed in AVS.   Hamilton Capri, RN 08/16/2020  3:05 PM

## 2020-08-25 ENCOUNTER — Encounter: Payer: Medicaid Other | Admitting: Obstetrics and Gynecology

## 2020-09-20 ENCOUNTER — Encounter: Payer: Self-pay | Admitting: Obstetrics and Gynecology

## 2020-09-20 ENCOUNTER — Other Ambulatory Visit: Payer: Self-pay

## 2020-09-20 ENCOUNTER — Ambulatory Visit (INDEPENDENT_AMBULATORY_CARE_PROVIDER_SITE_OTHER): Payer: Medicaid Other | Admitting: Obstetrics and Gynecology

## 2020-09-20 ENCOUNTER — Other Ambulatory Visit (HOSPITAL_COMMUNITY)
Admission: RE | Admit: 2020-09-20 | Discharge: 2020-09-20 | Disposition: A | Discharge status: Home / Self Care | Payer: Medicaid Other | Source: Ambulatory Visit | Attending: Obstetrics and Gynecology | Admitting: Obstetrics and Gynecology

## 2020-09-20 DIAGNOSIS — Z3A11 11 weeks gestation of pregnancy: Secondary | ICD-10-CM | POA: Diagnosis not present

## 2020-09-20 DIAGNOSIS — Z3481 Encounter for supervision of other normal pregnancy, first trimester: Secondary | ICD-10-CM

## 2020-09-20 DIAGNOSIS — Z87898 Personal history of other specified conditions: Secondary | ICD-10-CM

## 2020-09-20 DIAGNOSIS — O219 Vomiting of pregnancy, unspecified: Secondary | ICD-10-CM

## 2020-09-20 MED ORDER — DOXYLAMINE-PYRIDOXINE 10-10 MG PO TBEC: 2.0000 | DELAYED_RELEASE_TABLET | Freq: Every day | ORAL | 5 refills | Status: DC

## 2020-09-20 NOTE — Progress Notes (Signed)
INITIAL PRENATAL VISIT NOTE  Subjective:  Kristi Novak is a 25 y.o. G3P2002 at 37w5dby u/s being seen today for her initial prenatal visit. . She has an obstetric history significant for SVD x 2. She has a medical history significant for history of seizures, but none since childhood.  Pt does not see a neurologist or take antiseizure medications.  Patient reports nausea.  Contractions: Not present. Vag. Bleeding: None.   . Denies leaking of fluid.    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    Hypertropia RIGHT EYE   [place]   Idiopathic   had two seizures (none since)    Past Surgical History:  Procedure Laterality Date   [facility]  [facility]  AND  1999 BILATERAL EYES 1999   EYE SURGERY     MEDIAN RECTUS REPAIR  08/21/2011   Procedure: MEDIAN RECTUS REPAIR;  Surgeon: MDara Hoyer MD;  [facility]  Service: Ophthalmology;  Laterality: Right;  inferior oblique myectomy   TYMPANOSTOMY TUBE PLACEMENT  AGE 47   AND ADENOIDECTOMY   WISDOM TOOTH EXTRACTION     ORAL SURGEON OFFICE    OB History  Gravida Para Term Preterm AB Living  3 2 2  0 0 2  SAB IAB Ectopic Multiple Live Births  0 0 0 0 2    # Outcome Date GA Lbr Len/2nd Weight Sex Delivery Anes PTL Lv  3 Current           2 Term 02/12/18 342w5d2:50 / 02:36 6 lb 13.3 oz (3.099 kg) F Vag-Spont EPI  LIV  1 Term 12/20/10 399w2d9:45 / 00:45 6 lb 8.8 oz (2.971 kg) M Vag-Spont EPI  LIV    Social History   Socioeconomic History   Marital status: Single    Spouse name: Not on file   Number of children: 1   Years of education: Not on file   Highest education level: Not on file  Occupational History   Occupation: student  Tobacco Use   Smoking status: Never   Smokeless tobacco: Never  Vaping Use   Vaping Use: Never used  Substance and  Sexual Activity   Alcohol use: No   Drug use: No   Sexual activity: Yes    Partners: Male    Birth control/protection: None  Other Topics Concern   Not on file  Social History Narrative   Lives with mother, grandmother and child.     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    Social Determinants of Health   Financial Resource Strain: Not on file  Food Insecurity: Not on file  Transportation Needs: Not on file  Physical Activity: Not on file  Stress: Not on file  Social Connections: Not on file    Family History  Problem Relation Age of Onset   Cancer Maternal Grandmother        breast   Hypertension Other    Healthy Mother    Healthy Father      Current Outpatient Medications:    Doxylamine-Pyridoxine (DICLEGIS) 10-10 MG TBEC, Take 2 tablets by mouth at bedtime. If symptoms persist, add one tablet in the morning and one in the afternoon, Disp: 100 tablet, Rfl: 5   Blood Pressure Monitoring (BLOOD PRESSURE KIT)  DEVI, 1 kit by Does not apply route once a week., Disp: 1 each, Rfl: 0   Misc. Devices (GOJJI WEIGHT SCALE) MISC, 1 Device by Does not apply route every 30 (thirty) days., Disp: 1 each, Rfl: 0   Prenatal Vit-Fe Phos-FA-Omega (VITAFOL GUMMIES) 3.33-0.333-34.8 MG CHEW, Chew 3 tablets by mouth daily., Disp: 90 tablet, Rfl: 11  No Known Allergies  Review of Systems: Negative except for what is mentioned in HPI.  Objective:   Vitals:   09/20/20 1021  BP: 125/84  Pulse: 90  Weight: 238 lb (108 kg)    Fetal Status: Fetal Heart Rate (bpm): 155         Physical Exam: BP 125/84   Pulse 90   Wt 238 lb (108 kg)   LMP 05/28/2020   BMI 38.41 kg/m  CONSTITUTIONAL: Well-developed, well-nourished female in no acute distress.  NEUROLOGIC: Alert and oriented to person, place, and time. Normal reflexes, muscle tone coordination. No cranial nerve deficit noted. PSYCHIATRIC: Normal mood and affect. Normal behavior. Normal judgment and thought  content. SKIN: Skin is warm and dry. No rash noted. Not diaphoretic. No erythema. No pallor. HENT:  Normocephalic, atraumatic, External right and left ear normal. Oropharynx is clear and moist EYES: Conjunctivae and EOM are normal.  NECK: Normal range of motion, supple, no masses CARDIOVASCULAR: Normal heart rate noted, regular rhythm RESPIRATORY: Effort and breath sounds normal, no problems with respiration noted BREASTS: symmetric,  non-tender, no masses palpable ABDOMEN: Soft, nontender, nondistended, gravid. GU: normal appearing external female genitalia, multiparous normal appearing cervix, scant white discharge in vagina, no lesions noted Bimanual: 11 weeks sized uterus, no adnexal tenderness or palpable lesions noted MUSCULOSKELETAL: Normal range of motion. EXT:  No edema and no tenderness. 2+ distal pulses.   Assessment and Plan:  Pregnancy: G3P2002 at 59w5dby ultrasound  1. Encounter for supervision of other normal pregnancy in first trimester Continue routine care, will get genetic testing at next visit to hopefully have increased fetal fraction - C[place]ic[place]inal [place]illary only( [place] - Cytology - PAP( [place]) - Culture, OB Urine - [place] [facility] COMP + 14 WK; Future - CBC/D/Plt+RPR+Rh+ABO+RubIgG...  2. [redacted] weeks gestation of pregnancy   3. Nausea/vomiting in pregnancy  - Doxylamine-Pyridoxine (DICLEGIS) 10-10 MG TBEC; Take 2 tablets by mouth at bedtime. If symptoms persist, add one tablet in the morning and one in the afternoon  Dispense: 100 tablet; Refill: 5  4. History of seizures Will monitor, no interventions at this time   Preterm labor symptoms and general obstetric precautions including but not limited to vaginal bleeding, contractions, leaking of fluid and fetal movement were reviewed in detail with the patient.  Please refer to After Visit Summary for other counseling recommendations.   Return in about 4 weeks (around 10/18/2020) for ROB, in  person.  LGriffin Basil9/14/2022 10:53 AM

## 2020-09-20 NOTE — Progress Notes (Addendum)
NOB c/o vomiting. Will do Genetic screening with [facility].

## 2020-09-21 LAB — CBC/D/PLT+RPR+RH+ABO+RUBIGG...
Antibody Screen: NEGATIVE
Basophils Absolute: 0 10*3/uL (ref 0.0–0.2)
Basos: 0 %
EOS (ABSOLUTE): 0 10*3/uL (ref 0.0–0.4)
Eos: 0 %
HCV Ab: <0.1 s/co ratio (ref 0.0–0.9)
HIV Screen 4th Generation wRfx: NONREACTIVE
Hematocrit: 37.7 % (ref 34.0–46.6)
Hemoglobin: 12.4 g/dL (ref 11.1–15.9)
Hepatitis B Surface Ag: NEGATIVE
Immature Grans (Abs): 0 10*3/uL (ref 0.0–0.1)
Immature Granulocytes: 0 %
Lymphocytes Absolute: 2.2 10*3/uL (ref 0.7–3.1)
Lymphs: 28 %
MCH: 28.5 pg (ref 26.6–33.0)
MCHC: 32.9 g/dL (ref 31.5–35.7)
MCV: 87 fL (ref 79–97)
Monocytes Absolute: 0.6 10*3/uL (ref 0.1–0.9)
Monocytes: 8 %
Neutrophils Absolute: 4.9 10*3/uL (ref 1.4–7.0)
Neutrophils: 64 %
Platelets: 254 10*3/uL (ref 150–450)
RBC: 4.35 x10E6/uL (ref 3.77–5.28)
RDW: 13.1 % (ref 11.7–15.4)
RPR Ser Ql: NONREACTIVE
Rh Factor: POSITIVE
Rubella Antibodies, IGG: 3.52 index (ref >0.99)
WBC: 7.7 10*3/uL (ref 3.4–10.8)

## 2020-09-21 LAB — CERVICOVAGINAL ANCILLARY ONLY
Chlamydia: NEGATIVE
Comment: NEGATIVE
Comment: NEGATIVE
Comment: NORMAL
Neisseria Gonorrhea: NEGATIVE
Trichomonas: NEGATIVE

## 2020-09-21 LAB — HCV INTERPRETATION

## 2020-09-25 LAB — CYTOLOGY - PAP
Comment: NEGATIVE
Diagnosis: NEGATIVE
High risk HPV: NEGATIVE

## 2020-09-26 LAB — URINE CULTURE, OB REFLEX

## 2020-09-26 LAB — CULTURE, OB URINE

## 2020-09-28 ENCOUNTER — Other Ambulatory Visit: Payer: Self-pay

## 2020-09-28 DIAGNOSIS — O2341 Unspecified infection of urinary tract in pregnancy, first trimester: Secondary | ICD-10-CM

## 2020-09-28 MED ORDER — SULFAMETHOXAZOLE-TRIMETHOPRIM 800-160 MG PO TABS: 1.0000 | ORAL_TABLET | Freq: Two times a day (BID) | ORAL | 0 refills | Status: DC

## 2020-09-28 NOTE — Progress Notes (Signed)
ctr

## 2020-10-03 ENCOUNTER — Other Ambulatory Visit: Payer: Self-pay | Admitting: *Deleted

## 2020-10-03 ENCOUNTER — Telehealth: Payer: Self-pay | Admitting: *Deleted

## 2020-10-03 DIAGNOSIS — O2341 Unspecified infection of urinary tract in pregnancy, first trimester: Secondary | ICD-10-CM

## 2020-10-03 MED ORDER — AMOXICILLIN-POT CLAVULANATE 500-125 MG PO TABS: 500.0000 mg | ORAL_TABLET | Freq: Two times a day (BID) | ORAL | 0 refills | Status: DC

## 2020-10-03 NOTE — Telephone Encounter (Signed)
Returned TC to patient regarding nausea when taking Bactrim DS. Dr. Donavan Foil was consulted and new RX for Augmentin was sent in for patient. Patient informed. Patient instructed to take medication with food and drink plenty of water. Patient given further instructions on managing nausea in early pregnancy. Has diclegis RX already.  Patient states directions for use are not on medication. Instructed to take 2 at bedtime and at one in the morning and one in the afternoon for continued nausea. Patient verbalized understanding.

## 2020-10-05 ENCOUNTER — Encounter: Payer: Self-pay | Admitting: Obstetrics

## 2020-10-18 ENCOUNTER — Other Ambulatory Visit: Payer: Self-pay

## 2020-10-18 ENCOUNTER — Ambulatory Visit (INDEPENDENT_AMBULATORY_CARE_PROVIDER_SITE_OTHER): Payer: Medicaid Other | Admitting: Obstetrics and Gynecology

## 2020-10-18 VITALS — BP 122/81 | HR 90 | Wt 247.1 lb

## 2020-10-18 DIAGNOSIS — Z3482 Encounter for supervision of other normal pregnancy, second trimester: Secondary | ICD-10-CM

## 2020-10-18 DIAGNOSIS — Z3A15 15 weeks gestation of pregnancy: Secondary | ICD-10-CM | POA: Insufficient documentation

## 2020-10-18 DIAGNOSIS — Z87898 Personal history of other specified conditions: Secondary | ICD-10-CM

## 2020-10-18 NOTE — Progress Notes (Signed)
   PRENATAL VISIT NOTE  Subjective:  Kristi Novak is a 25 y.o. G3P2002 at [redacted]w[redacted]d being seen today for ongoing prenatal care.  She is currently monitored for the following issues for this low-risk pregnancy and has Hypertropia; Acid reflux; H/O chlamydia infection; H/O trichomoniasis; Encounter for supervision of normal pregnancy in second trimester; [redacted] weeks gestation of pregnancy; Nausea/vomiting in pregnancy; History of seizures; and [redacted] weeks gestation of pregnancy on their problem list.  Patient doing well with no acute concerns today. She reports no complaints.  Contractions: Not present. Vag. Bleeding: None.  Movement: Absent. Denies leaking of fluid.   The following portions of the patient's history were reviewed and updated as appropriate: allergies, current medications, past family history, past medical history, past social history, past surgical history and problem list. Problem list updated.  Objective:   Vitals:   10/18/20 1327  BP: 122/81  Pulse: 90  Weight: 247 lb 1.6 oz (112.1 kg)    Fetal Status: Fetal Heart Rate (bpm): 145 Fundal Height: 15 cm Movement: Absent     General:  Alert, oriented and cooperative. Patient is in no acute distress.  Skin: Skin is warm and dry. No rash noted.   Cardiovascular: Normal heart rate noted  Respiratory: Normal respiratory effort, no problems with respiration noted  Abdomen: Soft, gravid, appropriate for gestational age.  Pain/Pressure: Absent     Pelvic: Cervical exam deferred        Extremities: Normal range of motion.  Edema: None  Mental Status:  Normal mood and affect. Normal behavior. Normal judgment and thought content.   Assessment and Plan:  Pregnancy: G3P2002 at [redacted]w[redacted]d  1. [redacted] weeks gestation of pregnancy   2. History of seizures Pt reports seizures as a child, no hx of seizure in 10+ years, she is not followed by neurology or is taking any medication  3. Encounter for supervision of other normal pregnancy in second  trimester Continue routine care - Genetic Screening  Preterm labor symptoms and general obstetric precautions including but not limited to vaginal bleeding, contractions, leaking of fluid and fetal movement were reviewed in detail with the patient.  Please refer to After Visit Summary for other counseling recommendations.   Return in about 4 weeks (around 11/15/2020) for ROB, in person, [facility] next visit.   Mariel Aloe, MD Faculty Attending Center [facility]

## 2020-10-18 NOTE — Progress Notes (Signed)
Pt reports that she is not feeling fetal movement yet, denies pain. 

## 2020-10-24 ENCOUNTER — Encounter: Payer: Self-pay | Admitting: Obstetrics and Gynecology

## 2020-11-10 ENCOUNTER — Other Ambulatory Visit: Payer: Self-pay | Admitting: Obstetrics and Gynecology

## 2020-11-10 ENCOUNTER — Other Ambulatory Visit: Payer: Self-pay

## 2020-11-10 ENCOUNTER — Ambulatory Visit: Payer: Medicaid Other | Attending: Obstetrics and Gynecology

## 2020-11-10 DIAGNOSIS — Z3481 Encounter for supervision of other normal pregnancy, first trimester: Secondary | ICD-10-CM

## 2020-11-10 DIAGNOSIS — E669 Obesity, unspecified: Secondary | ICD-10-CM | POA: Diagnosis not present

## 2020-11-10 DIAGNOSIS — O09212 Supervision of pregnancy with history of pre-term labor, second trimester: Secondary | ICD-10-CM | POA: Diagnosis not present

## 2020-11-10 DIAGNOSIS — Z3A19 19 weeks gestation of pregnancy: Secondary | ICD-10-CM | POA: Insufficient documentation

## 2020-11-10 DIAGNOSIS — O99212 Obesity complicating pregnancy, second trimester: Secondary | ICD-10-CM | POA: Insufficient documentation

## 2020-11-15 ENCOUNTER — Encounter: Payer: Self-pay | Admitting: Women's Health

## 2020-11-15 ENCOUNTER — Ambulatory Visit (INDEPENDENT_AMBULATORY_CARE_PROVIDER_SITE_OTHER): Payer: Medicaid Other | Admitting: Women's Health

## 2020-11-15 ENCOUNTER — Other Ambulatory Visit: Payer: Self-pay

## 2020-11-15 VITALS — BP 113/67 | HR 78 | Wt 246.0 lb

## 2020-11-15 DIAGNOSIS — O2340 Unspecified infection of urinary tract in pregnancy, unspecified trimester: Secondary | ICD-10-CM

## 2020-11-15 DIAGNOSIS — Z3A19 19 weeks gestation of pregnancy: Secondary | ICD-10-CM

## 2020-11-15 DIAGNOSIS — Z3482 Encounter for supervision of other normal pregnancy, second trimester: Secondary | ICD-10-CM

## 2020-11-15 NOTE — Patient Instructions (Addendum)
Maternity Assessment Unit (MAU)  The Maternity Assessment Unit (MAU) is located at the [facility] at [facility]. The address is: [address] [place], [place], [place] [address]. Please see map below for additional directions.    The Maternity Assessment Unit is designed to help you during your pregnancy, and for up to 6 weeks after delivery, with any pregnancy- or postpartum-related emergencies, if you think you are in labor, or if your water has broken. For example, if you experience nausea and vomiting, vaginal bleeding, severe abdominal or pelvic pain, elevated blood pressure or other problems related to your pregnancy or postpartum time, please come to the Maternity Assessment Unit for assistance.       Second Trimester of Pregnancy The second trimester of pregnancy is from week 13 through week 27. This is months 4 through 6 of pregnancy. The second trimester is often a time when you feel your best. Your body has adjusted to being pregnant, and you begin to feel better physically. During the second trimester: Morning sickness has lessened or stopped completely. You may have more energy. You may have an increase in appetite. The second trimester is also a time when the unborn baby (fetus) is growing rapidly. At the end of the sixth month, the fetus may be up to 12 inches long and weigh about 1 pounds. You will likely begin to feel the baby move (quickening) between 16 and 20 weeks of pregnancy. Body changes during your second trimester Your body continues to go through many changes during your second trimester. The changes vary and generally return to normal after the baby is born. Physical changes Your weight will continue to increase. You will notice your lower abdomen bulging out. You may begin to get stretch marks on your hips, abdomen, and breasts. Your breasts will continue to grow and to become tender. Dark spots or blotches (chloasma or  mask of pregnancy) may develop on your face. A dark line from your belly button to the pubic area (linea nigra) may appear. You may have changes in your hair. These can include thickening of your hair, rapid growth, and changes in texture. Some people also have hair loss during or after pregnancy, or hair that feels dry or thin. Health changes You may develop headaches. You may have heartburn. You may develop constipation. You may develop hemorrhoids or swollen, bulging veins (varicose veins). Your gums may bleed and may be sensitive to brushing and flossing. You may urinate more often because the fetus is pressing on your bladder. You may have back pain. This is caused by: Weight gain. Pregnancy hormones that are relaxing the joints in your pelvis. A shift in weight and the muscles that support your balance. Follow these instructions at home: Medicines Follow your health care provider's instructions regarding medicine use. Specific medicines may be either safe or unsafe to take during pregnancy. Do not take any medicines unless approved by your health care provider. Take a prenatal vitamin that contains at least 600 micrograms (mcg) of folic acid. Eating and drinking Eat a healthy diet that includes fresh fruits and vegetables, whole grains, good sources of protein such as meat, eggs, or tofu, and low-fat dairy products. Avoid raw meat and unpasteurized juice, milk, and cheese. These carry germs that can harm you and your baby. You may need to take these actions to prevent or treat constipation: Drink enough fluid to keep your urine pale yellow. Eat foods that are high in fiber, such  as beans, whole grains, and fresh fruits and vegetables. Limit foods that are high in fat and processed sugars, such as fried or sweet foods. Activity Exercise only as directed by your health care provider. Most people can continue their usual exercise routine during pregnancy. Try to exercise for 30 minutes  at least 5 days a week. Stop exercising if you develop contractions in your uterus. Stop exercising if you develop pain or cramping in the lower abdomen or lower back. Avoid exercising if it is very hot or humid or if you are at a high altitude. Avoid heavy lifting. If you choose to, you may have sex unless your health care provider tells you not to. Relieving pain and discomfort Wear a supportive bra to prevent discomfort from breast tenderness. Take warm sitz baths to soothe any pain or discomfort caused by hemorrhoids. Use hemorrhoid cream if your health care provider approves. Rest with your legs raised (elevated) if you have leg cramps or low back pain. If you develop varicose veins: Wear support hose as told by your health care provider. Elevate your feet for 15 minutes, 3-4 times a day. Limit salt in your diet. Safety Wear your seat belt at all times when driving or riding in a car. Talk with your health care provider if someone is verbally or physically abusive to you. Lifestyle Do not use hot tubs, steam rooms, or saunas. Do not douche. Do not use tampons or scented sanitary pads. Avoid cat litter boxes and soil used by cats. These carry germs that can cause birth defects in the baby and possibly loss of the fetus by miscarriage or stillbirth. Do not use herbal remedies, alcohol, illegal drugs, or medicines that are not approved by your health care provider. Chemicals in these products can harm your baby. Do not use any products that contain nicotine or tobacco, such as cigarettes, e-cigarettes, and chewing tobacco. If you need help quitting, ask your health care provider. General instructions During a routine prenatal visit, your health care provider will do a physical exam and other tests. He or she will also discuss your overall health. Keep all follow-up visits. This is important. Ask your health care provider for a referral to a local prenatal education class. Ask for help if  you have counseling or nutritional needs during pregnancy. Your health care provider can offer advice or refer you to specialists for help with various needs. Where to find more information American Pregnancy Association: americanpregnancy.org [employer]: https://www.todd-brady.net/ Office on [employer] Health: MightyReward.co.nz Contact a health care provider if you have: A headache that does not go away when you take medicine. Vision changes or you see spots in front of your eyes. Mild pelvic cramps, pelvic pressure, or nagging pain in the abdominal area. Persistent nausea, vomiting, or diarrhea. A bad-smelling vaginal discharge or foul-smelling urine. Pain when you urinate. Sudden or extreme swelling of your face, hands, ankles, feet, or legs. A fever. Get help right away if you: Have fluid leaking from your vagina. Have spotting or bleeding from your vagina. Have severe abdominal cramping or pain. Have difficulty breathing. Have chest pain. Have fainting spells. Have not felt your baby move for the time period told by your health care provider. Have new or increased pain, swelling, or redness in an arm or leg. Summary The second trimester of pregnancy is from week 13 through week 27 (months 4 through 6). Do not use herbal remedies, alcohol, illegal drugs, or medicines that are not  approved by your health care provider. Chemicals in these products can harm your baby. Exercise only as directed by your health care provider. Most people can continue their usual exercise routine during pregnancy. Keep all follow-up visits. This is important. This information is not intended to replace advice given to you by your health care provider. Make sure you discuss any questions you have with your health care provider. Document Revised: 06/02/2019 Document Reviewed: 04/08/2019 Elsevier Patient Education  2022 Elsevier  Inc.       Alpha-Fetoprotein Test Why am I having this test? The alpha-fetoprotein test is a lab test most commonly used for pregnant women to help screen for birth defects in their unborn baby. It can be used to screen for chromosome (DNA) abnormalities, problems with the brain or spinal cord, or problems with the abdominal wall of the unborn baby (fetus). The alpha-fetoprotein test may also be done for men or nonpregnant women to check for certain cancers. What is being tested? This test measures the amount of alpha-fetoprotein (AFP) in your blood. AFP is a protein that is made by the liver. Levels can be detected in the mother's blood during pregnancy, starting at 10 weeks and peaking at 16-18 weeks of the pregnancy. Abnormal levels can sometimes be a sign of a birth defect in the baby. Certain cancers can cause a high level of AFP in men and nonpregnant women. What kind of sample is taken? A blood sample is required for this test. It is usually collected by inserting a needle into a blood vessel. How are the results reported? Your test results will be reported as values. Your health care provider will compare your results to normal ranges that were established after testing a large group of people (reference values). Reference values may vary among labs and hospitals. For this test, common reference values are: Adult: Less than 40 ng/mL or less than 40 mcg/L (SI units). Child younger than 1 year: Less than 30 ng/mL. If you are pregnant, the values may also vary based on how long you have been pregnant. What do the results mean? Results that are above the reference values in pregnant women may indicate the following for the baby: Neural tube defects, such as abnormalities of the spinal cord or brain. Abdominal wall defects. Multiple pregnancy such as twins. Fetal distress or fetal death. Results that are above the reference values in men or nonpregnant women may indicate: Reproductive  cancers, such as ovarian or testicular cancer. Liver cancer. Liver cell death. Other types of cancer. Very low levels of AFP in pregnant women may indicate Down syndrome for the baby. Talk with your health care provider about what your results mean. Questions to ask your health care provider Ask your health care provider, or the department that is doing the test: When will my results be ready? How will I get my results? What are my treatment options? What other tests do I need? What are my next steps? Summary The alpha-fetoprotein test is done on pregnant women to help screen for birth defects in their unborn baby. Certain cancers can cause a high level of AFP in men and nonpregnant women. For this test, a blood sample is usually collected by inserting a needle into a blood vessel. Talk with your health care provider about what your results mean. This information is not intended to replace advice given to you by your health care provider. Make sure you discuss any questions you have with your health care provider. Document Revised:  07/16/2019 Document Reviewed: 07/16/2019 Elsevier Patient Education  2022 Elsevier Inc.       Preterm Labor The normal length of a pregnancy is 39-41 weeks. Preterm labor is when labor starts before 37 completed weeks of pregnancy. Babies who are born prematurely and survive may not be fully developed and may be at an increased risk for long-term problems such as cerebral palsy, developmental delays, and vision and hearing problems. Babies who are born too early may have problems soon after birth. Premature babies may have problems regulating blood sugar, body temperature, heart rate, and breathing rate. These babies often have trouble with feeding. The risk of having problems is highest for babies who are born before 34 weeks of pregnancy. What are the causes? The exact cause of this condition is not known. What increases the risk? You are more likely to  have preterm labor if you have certain risk factors that relate to your medical history, problems with present and past pregnancies, and lifestyle factors. Medical history You have abnormalities of the uterus, including a short cervix. You have STIs (sexually transmitted infections) or other infections of the urinary tract and the vagina. You have chronic illnesses, such as blood clotting problems, diabetes, or high blood pressure. You are overweight or underweight. Present and past pregnancies You have had preterm labor before. You are pregnant with twins or other multiples. You have been diagnosed with a condition in which the placenta covers your cervix (placenta previa). You waited less than 18 months between giving birth and becoming pregnant again. Your unborn baby has some abnormalities. You have vaginal bleeding during pregnancy. You became pregnant through in vitro fertilization (IVF). Lifestyle and environmental factors You use tobacco products or drink alcohol. You use drugs. You have stress and no social support. You experience domestic violence. You are exposed to certain chemicals or environmental pollutants. Other factors You are younger than age 9 or older than age 65. What are the signs or symptoms? Symptoms of this condition include: Cramps similar to those that can happen during a menstrual period. The cramps may happen with diarrhea. Pain in the abdomen or lower back. Regular contractions that may feel like tightening of the abdomen. A feeling of increased pressure in the pelvis. Increased watery or bloody mucus discharge from the vagina. Water breaking (ruptured amniotic sac). How is this diagnosed? This condition is diagnosed based on: Your medical history and a physical exam. A pelvic exam. An ultrasound. Monitoring your uterus for contractions. Other tests, including: A swab of the cervix to check for a chemical called fetal fibronectin. Urine  tests. How is this treated? Treatment for this condition depends on the length of your pregnancy, your condition, and the health of your baby. Treatment may include: Taking medicines, such as: Hormone medicines. These may be given early in pregnancy to help support the pregnancy. Medicines to stop contractions. Medicines to help mature the baby's lungs. These may be prescribed if the risk of delivery is high. Medicines to help protect your baby from brain and nerve complications such as cerebral palsy. Bed rest. If the labor happens before 34 weeks of pregnancy, you may need to stay in the hospital. Delivery of the baby. Follow these instructions at home:  Do not use any products that contain nicotine or tobacco. These products include cigarettes, chewing tobacco, and vaping devices, such as e-cigarettes. If you need help quitting, ask your health care provider. Do not drink alcohol. Take over-the-counter and prescription medicines only as told by  your health care provider. Rest as told by your health care provider. Return to your normal activities as told by your health care provider. Ask your health care provider what activities are safe for you. Keep all follow-up visits. This is important. How is this prevented? To increase your chance of having a full-term pregnancy: Do not use drugs or take medicines that have not been prescribed to you during your pregnancy. Talk with your health care provider before taking any herbal supplements, even if you have been taking them regularly. Make sure you gain a healthy amount of weight during your pregnancy. Watch for infection. If you think that you might have an infection, get it checked right away. Symptoms of infection may include: Fever. Abnormal vaginal discharge or discharge that smells bad. Pain or burning with urination. Needing to urinate urgently. Frequently urinating or passing small amounts of urine frequently. Blood in your urine or  urine that smells bad or unusual. Where to find more information U.S. Department of Health and Cytogeneticist on Women's Health: http://hoffman.com/ [employer]: www.acog.org Centers for Disease Control and Prevention, Preterm Birth: FootballExhibition.com.br Contact a health care provider if: You think you are going into preterm labor. You have signs or symptoms of preterm labor. You have symptoms of infection. Get help right away if: You are having regular, painful contractions every 5 minutes or less. Your water breaks. Summary Preterm labor is labor that starts before you reach 37 weeks of pregnancy. Delivering your baby early increases your baby's risk of developing long-term problems. You are more likely to have preterm labor if you have certain risk factors that relate to your medical history, problems with present and past pregnancies, and lifestyle factors. Keep all follow-up visits. This is important. Contact a health care provider if you have signs or symptoms of preterm labor. This information is not intended to replace advice given to you by your health care provider. Make sure you discuss any questions you have with your health care provider. Document Revised: 12/28/2019 Document Reviewed: 12/28/2019 Elsevier Patient Education  2022 Elsevier Inc.       Oral Glucose Tolerance Test During Pregnancy Why am I having this test? The oral glucose tolerance test (OGTT) is done to check how your body processes blood sugar (glucose). This is one of several tests used to diagnose diabetes that develops during pregnancy (gestational diabetes mellitus). Gestational diabetes is a short-term form of diabetes that some women develop while they are pregnant. It usually occurs during the second trimester of pregnancy and goes away after delivery. Testing, or screening, for gestational diabetes usually occurs at weeks 24-28 of pregnancy. You may have  the OGTT test after having a 1-hour glucose screening test if the results from that test indicate that you may have gestational diabetes. This test may also be needed if: You have a history of gestational diabetes. There is a history of giving birth to very large babies or of losing pregnancies (having stillbirths). You have signs and symptoms of diabetes, such as: Changes in your eyesight. Tingling or numbness in your hands or feet. Changes in hunger, thirst, and urination, and these are not explained by your pregnancy. What is being tested? This test measures the amount of glucose in your blood at different times during a period of 3 hours. This shows how well your body can process glucose. What kind of sample is taken? Blood samples are required for this test. They are usually collected by  inserting a needle into a blood vessel. How do I prepare for this test? For 3 days before your test, eat normally. Have plenty of carbohydrate-rich foods. Follow instructions from your health care provider about: Eating or drinking restrictions on the day of the test. You may be asked not to eat or drink anything other than water (to fast) starting 8-10 hours before the test. Changing or stopping your regular medicines. Some medicines may interfere with this test. Tell a health care provider about: All medicines you are taking, including vitamins, herbs, eye drops, creams, and over-the-counter medicines. Any blood disorders you have. Any surgeries you have had. Any medical conditions you have. What happens during the test? First, your blood glucose will be measured. This is referred to as your fasting blood glucose because you fasted before the test. Then, you will drink a glucose solution that contains a certain amount of glucose. Your blood glucose will be measured again 1, 2, and 3 hours after you drink the solution. This test takes about 3 hours to complete. You will need to stay at the testing  location during this time. During the testing period: Do not eat or drink anything other than the glucose solution. Do not exercise. Do not use any products that contain nicotine or tobacco, such as cigarettes, e-cigarettes, and chewing tobacco. These can affect your test results. If you need help quitting, ask your health care provider. The testing procedure may vary among health care providers and hospitals. How are the results reported? Your results will be reported as milligrams of glucose per deciliter of blood (mg/dL) or millimoles per liter (mmol/L). There is more than one source for screening and diagnosis reference values used to diagnose gestational diabetes. Your health care provider will compare your results to normal values that were established after testing a large group of people (reference values). Reference values may vary among labs and hospitals. For this test (Carpenter-Coustan), reference values are: Fasting: 95 mg/dL (5.3 mmol/L). 1 hour: 180 mg/dL (16.1 mmol/L). 2 hour: 155 mg/dL (8.6 mmol/L). 3 hour: 140 mg/dL (7.8 mmol/L). What do the results mean? Results below the reference values are considered normal. If two or more of your blood glucose levels are at or above the reference values, you may be diagnosed with gestational diabetes. If only one level is high, your health care provider may suggest repeat testing or other tests to confirm a diagnosis. Talk with your health care provider about what your results mean. Questions to ask your health care provider Ask your health care provider, or the department that is doing the test: When will my results be ready? How will I get my results? What are my treatment options? What other tests do I need? What are my next steps? Summary The oral glucose tolerance test (OGTT) is one of several tests used to diagnose diabetes that develops during pregnancy (gestational diabetes mellitus). Gestational diabetes is a short-term form of  diabetes that some women develop while they are pregnant. You may have the OGTT test after having a 1-hour glucose screening test if the results from that test show that you may have gestational diabetes. You may also have this test if you have any symptoms or risk factors for this type of diabetes. Talk with your health care provider about what your results mean. This information is not intended to replace advice given to you by your health care provider. Make sure you discuss any questions you have with your health care provider. Document Revised:  06/03/2019 Document Reviewed: 06/03/2019 Elsevier Patient Education  2022 [employer].

## 2020-11-15 NOTE — Progress Notes (Signed)
Subjective:  Kristi Novak is a 25 y.o. G3P2002 at [redacted]w[redacted]d being seen today for ongoing prenatal care.  She is currently monitored for the following issues for this low-risk pregnancy and has Hypertropia; H/O chlamydia infection; H/O trichomoniasis; Encounter for supervision of normal pregnancy in second trimester; Nausea/vomiting in pregnancy; and History of seizures on their problem list.  Patient reports no complaints.  Contractions: Not present. Vag. Bleeding: None.  Movement: Present. Denies leaking of fluid.   The following portions of the patient's history were reviewed and updated as appropriate: allergies, current medications, past family history, past medical history, past social history, past surgical history and problem list. Problem list updated.  Objective:   Vitals:   11/15/20 1531  BP: 113/67  Pulse: 78  Weight: 246 lb (111.6 kg)    Fetal Status: Fetal Heart Rate (bpm): 135   Movement: Present     General:  Alert, oriented and cooperative. Patient is in no acute distress.  Skin: Skin is warm and dry. No rash noted.   Cardiovascular: Normal heart rate noted  Respiratory: Normal respiratory effort, no problems with respiration noted  Abdomen: Soft, gravid, appropriate for gestational age. Pain/Pressure: Absent     Pelvic: Vag. Bleeding: None     Cervical exam deferred        Extremities: Normal range of motion.  Edema: Trace  Mental Status: Normal mood and affect. Normal behavior. Normal judgment and thought content.   Urinalysis:      Assessment and Plan:  Pregnancy: G3P2002 at [redacted]w[redacted]d  1. Encounter for supervision of other normal pregnancy in second trimester - AFP, Serum, Open Spina Bifida - CBE info given - discussed travel in pregnancy, pt advised to stay well-hydrated, no sit for extended periods of time, frequent movement, especially if flying/driving, bring copy of medical records and locate closest hospital with L&D floor to present to with pregnancy related  emergency -pt reports she will be out of town until Christmas and will not be able to come in for a visit until early January, does not have a BP cuff, advised to have BP check in 4 weeks at local pharmacy  PHQ9 SCORE ONLY 08/16/2020  PHQ-9 Total Score 2   GAD 7 : Generalized Anxiety Score 08/16/2020  Nervous, Anxious, on Edge 0  Control/stop worrying 0  Worry too much - different things 0  Trouble relaxing 0  Restless 0  Easily annoyed or irritable 0  Afraid - awful might happen 0  Total GAD 7 Score 0   2. [redacted] weeks gestation of pregnancy  3. Urinary tract infection in pregnancy, antepartum - Culture, OB Urine  Preterm labor symptoms and general obstetric precautions including but not limited to vaginal bleeding, contractions, leaking of fluid and fetal movement were reviewed in detail with the patient. I discussed the assessment and treatment plan with the patient. The patient was provided an opportunity to ask questions and all were answered. The patient agreed with the plan and demonstrated an understanding of the instructions. The patient was advised to call back or seek an in-person office evaluation/go to MAU at [facility] for any urgent or concerning symptoms. Please refer to After Visit Summary for other counseling recommendations.  Return in about 8 weeks (around 01/10/2021) for in-person LOB/GTT/labs.   Lexy Meininger, Odie Sera, NP

## 2020-11-15 NOTE — Progress Notes (Signed)
Pt presents for ROB and AFP. Pt competed ABx treatment - UCx today.

## 2020-11-17 ENCOUNTER — Telehealth: Payer: Self-pay | Admitting: *Deleted

## 2020-11-17 LAB — AFP, SERUM, OPEN SPINA BIFIDA
AFP MoM: 1.85
AFP Value: 83.9 ng/mL
Gest. Age on Collection Date: 19.5 weeks
Maternal Age At EDD: 26 yr
OSBR Risk 1 IN: 2270
Test Results:: NEGATIVE
Weight: 246 [lb_av]

## 2020-11-17 NOTE — Telephone Encounter (Signed)
TC from patient concerning recent labs. Reassured patient that provider has not seen results yet but will review them and let her know if there is any reason for concern.

## 2020-11-22 LAB — CULTURE, OB URINE

## 2020-11-22 LAB — URINE CULTURE, OB REFLEX

## 2020-11-26 ENCOUNTER — Telehealth: Payer: Self-pay | Admitting: Women's Health

## 2020-11-26 ENCOUNTER — Encounter: Payer: Self-pay | Admitting: Women's Health

## 2020-11-26 DIAGNOSIS — O2342 Unspecified infection of urinary tract in pregnancy, second trimester: Secondary | ICD-10-CM | POA: Insufficient documentation

## 2020-11-26 NOTE — Progress Notes (Signed)
Attempted to call patient re: +UTI. No answer, unable to leave voicemail. Please call patient on Monday 11/21 to provide treatment per protocol.  Thank you, Joni Reining

## 2020-11-26 NOTE — Telephone Encounter (Signed)
Attempted to call patient re: +UTI, automated message stating "call cannot be completed at this time." Unable to leave VM.  Marylen Ponto, NP  10:38 AM 11/26/2020

## 2020-11-27 ENCOUNTER — Other Ambulatory Visit: Payer: Self-pay | Admitting: *Deleted

## 2020-11-27 MED ORDER — NITROFURANTOIN MONOHYD MACRO 100 MG PO CAPS: 100.0000 mg | ORAL_CAPSULE | Freq: Two times a day (BID) | ORAL | 0 refills | Status: AC

## 2020-11-27 NOTE — Progress Notes (Signed)
Pt positive for UTI, see lab result. Per provider, treat per protocol. Macrobid ordered today

## 2021-01-07 NOTE — L&D Delivery Note (Addendum)
OB/GYN Faculty Practice Delivery Note ? [name] is a 26 y.o. [phone] s/p SVD at [redacted]w[redacted]d. She was admitted for IOL due to FGR, since resolved.  ? ?ROM: 3h 46m with clear fluid ?GBS Status: Negative ?Maximum Maternal Temperature: 98.2 ? ?Labor Progress: ?Patient progressed to complete after Pitocin and AROM.  ? ?Delivery Date/Time: 03/23/2021 at 16:36 ? ?Delivery: Called to room and patient was complete and pushing. Head delivered LOA. No nuchal cord present. Shoulder and body delivered in usual fashion. Infant with spontaneous cry, placed on mother's abdomen, dried and stimulated. Cord clamped x 2 after 1-minute delay and cut by family member under direct supervision. Cord blood drawn. Placenta delivered spontaneously with gentle cord traction. Fundus firm with massage and Pitocin. Labia, perineum, vagina, and cervix inspected, and patient was found to have bilateral periurethral abrasions that were hemostatic and not repaired.  ? ?Placenta: Intact, 3-vessel cord, sent to L&D ?Complications: None ?Lacerations: Bilateral periurethral abrasions  ?EBL: 200 cc ?Analgesia: Epidural ? ?Infant: Vigorous baby girl  APGARs 8, 9  ? ?Theresia Majors, MD ?FM PGY-1 ?03/23/2021, 5:08 PM ? ?GME ATTESTATION:  ?I saw and evaluated the patient. I was gowned, gloved, and present for the entire delivery and management of the patient. I agree with the findings and the plan of care as documented in the resident?s note. I have made changes to documentation as necessary. ? ?Evalina Field, MD ?OB Fellow, Faculty Practice ?[place] ?03/23/2021 7:16 PM ? ? ? ? ?

## 2021-01-10 ENCOUNTER — Other Ambulatory Visit: Payer: Medicaid Other

## 2021-01-23 ENCOUNTER — Other Ambulatory Visit: Payer: Self-pay

## 2021-01-23 ENCOUNTER — Ambulatory Visit (INDEPENDENT_AMBULATORY_CARE_PROVIDER_SITE_OTHER): Payer: Medicaid Other | Admitting: Advanced Practice Midwife

## 2021-01-23 ENCOUNTER — Other Ambulatory Visit: Payer: Medicaid Other

## 2021-01-23 VITALS — BP 115/69 | HR 89 | Wt 253.0 lb

## 2021-01-23 DIAGNOSIS — O99013 Anemia complicating pregnancy, third trimester: Secondary | ICD-10-CM

## 2021-01-23 DIAGNOSIS — O26893 Other specified pregnancy related conditions, third trimester: Secondary | ICD-10-CM

## 2021-01-23 DIAGNOSIS — R102 Pelvic and perineal pain: Secondary | ICD-10-CM

## 2021-01-23 DIAGNOSIS — Z3482 Encounter for supervision of other normal pregnancy, second trimester: Secondary | ICD-10-CM

## 2021-01-23 DIAGNOSIS — Z3A29 29 weeks gestation of pregnancy: Secondary | ICD-10-CM

## 2021-01-23 DIAGNOSIS — O2343 Unspecified infection of urinary tract in pregnancy, third trimester: Secondary | ICD-10-CM

## 2021-01-23 NOTE — Progress Notes (Signed)
° °  PRENATAL VISIT NOTE  Subjective:  Kristi Novak is a 26 y.o. G3P2002 at [redacted]w[redacted]d being seen today for ongoing prenatal care.  She is currently monitored for the following issues for this low-risk pregnancy and has Hypertropia; Encounter for supervision of normal pregnancy in second trimester; Nausea/vomiting in pregnancy; History of seizures; and Urinary tract infection in pregnancy in second trimester, antepartum on their problem list.  Patient reports  pelvic pain that is constant, worsening over time .  Contractions: Irritability. Vag. Bleeding: None.  Movement: Present. Denies leaking of fluid.   The following portions of the patient's history were reviewed and updated as appropriate: allergies, current medications, past family history, past medical history, past social history, past surgical history and problem list.   Objective:   Vitals:   01/23/21 0821  BP: 115/69  Pulse: 89  Weight: 253 lb (114.8 kg)    Fetal Status: Fetal Heart Rate (bpm): 138 Fundal Height: 30 cm Movement: Present  Presentation: Vertex  General:  Alert, oriented and cooperative. Patient is in no acute distress.  Skin: Skin is warm and dry. No rash noted.   Cardiovascular: Normal heart rate noted  Respiratory: Normal respiratory effort, no problems with respiration noted  Abdomen: Soft, gravid, appropriate for gestational age.  Pain/Pressure: Present     Pelvic: Cervical exam performed in the presence of a chaperone Dilation: 1 Effacement (%): 0 Station: Ballotable  Extremities: Normal range of motion.  Edema: Trace  Mental Status: Normal mood and affect. Normal behavior. Normal judgment and thought content.   Assessment and Plan:  Pregnancy: G3P2002 at [redacted]w[redacted]d 1. Encounter for supervision of other normal pregnancy in second trimester --Anticipatory guidance about next visits/weeks of pregnancy given. --Next appt in 2 weeks  - Glucose Tolerance, 2 Hours w/1 Hour - RPR - CBC - HIV antibody (with  reflex)  2. [redacted] weeks gestation of pregnancy   3. Pelvic pain affecting pregnancy in third trimester, antepartum --Pt reports constant pain, worse with walking, movement.   --Cervix 1 cm but thick in office today --Monitors applied to evaluate contractions, none on toco or to palpation, FHR tracing with moderate variability, 10 x 10 accels, appropriate for gestational age --PTL precautions/reasons to go to MAU reviewed  --Rest/ice/heat/warm bath/increase PO fluids/Tylenol/pregnancy support belt - Ambulatory referral to Physical Therapy - Culture, OB Urine    Preterm labor symptoms and general obstetric precautions including but not limited to vaginal bleeding, contractions, leaking of fluid and fetal movement were reviewed in detail with the patient. Please refer to After Visit Summary for other counseling recommendations.   No follow-ups on file.  No future appointments.   Sharen Counter, CNM

## 2021-01-23 NOTE — Progress Notes (Signed)
ROB/GTT.  Pt declined TDAP vaccine, c/o pelvic pain and pressure.

## 2021-01-24 LAB — CBC
Hematocrit: 30.4 % — ABNORMAL LOW (ref 34.0–46.6)
Hemoglobin: 10.2 g/dL — ABNORMAL LOW (ref 11.1–15.9)
MCH: 28.1 pg (ref 26.6–33.0)
MCHC: 33.6 g/dL (ref 31.5–35.7)
MCV: 84 fL (ref 79–97)
Platelets: 272 10*3/uL (ref 150–450)
RBC: 3.63 x10E6/uL — ABNORMAL LOW (ref 3.77–5.28)
RDW: 13.3 % (ref 11.7–15.4)
WBC: 7.8 10*3/uL (ref 3.4–10.8)

## 2021-01-24 LAB — HIV ANTIBODY (ROUTINE TESTING W REFLEX): HIV Screen 4th Generation wRfx: NONREACTIVE

## 2021-01-24 LAB — GLUCOSE TOLERANCE, 2 HOURS W/ 1HR
Glucose, 1 hour: 130 mg/dL (ref 70–179)
Glucose, 2 hour: 107 mg/dL (ref 70–152)
Glucose, Fasting: 84 mg/dL (ref 70–91)

## 2021-01-24 LAB — RPR: RPR Ser Ql: NONREACTIVE

## 2021-01-24 MED ORDER — FERROUS SULFATE 325 (65 FE) MG PO TABS: 325.0000 mg | ORAL_TABLET | ORAL | 3 refills | Status: AC

## 2021-01-24 NOTE — Addendum Note (Signed)
Addended by: Sharen Counter A on: 01/24/2021 08:18 PM   Modules accepted: Orders

## 2021-01-25 ENCOUNTER — Other Ambulatory Visit: Payer: Self-pay

## 2021-01-25 ENCOUNTER — Ambulatory Visit: Payer: Medicaid Other | Attending: Advanced Practice Midwife

## 2021-01-25 DIAGNOSIS — R102 Pelvic and perineal pain: Secondary | ICD-10-CM | POA: Insufficient documentation

## 2021-01-25 DIAGNOSIS — O26893 Other specified pregnancy related conditions, third trimester: Secondary | ICD-10-CM | POA: Diagnosis not present

## 2021-01-25 DIAGNOSIS — R279 Unspecified lack of coordination: Secondary | ICD-10-CM | POA: Insufficient documentation

## 2021-01-25 DIAGNOSIS — M6281 Muscle weakness (generalized): Secondary | ICD-10-CM | POA: Insufficient documentation

## 2021-01-25 DIAGNOSIS — M62838 Other muscle spasm: Secondary | ICD-10-CM | POA: Insufficient documentation

## 2021-01-25 NOTE — Patient Instructions (Addendum)
Go to [employer] to see if pregnancy support belts or an SI belt will be covered by your insurance - or just search belts.   Access Code: [email or web address] URL: [email or web address] Date: 01/25/2021 Prepared by: Julio Alm  Exercises Seated Transversus Abdominis Bracing - 3 x daily - 7 x weekly - 1 sets - 10 reps Seated Hip Adduction Squeeze with Ball - 3 x daily - 7 x weekly - 1 sets - 10 reps Seated Isometric Hip Abduction with Manual Resistance - 3 x daily - 7 x weekly - 1 sets - 10 reps  [facility] [address], [address] [place], [place] [address] Phone # [phone] Fax [phone]

## 2021-01-25 NOTE — Therapy (Addendum)
[facility] @ [place], [place], [address] Phone: [phone]   Fax:  [phone]  Physical Therapy Evaluation  Patient Details  Name: Kristi Novak MRN: [id number] Date of Birth: 11/10/1995 Referring Provider (PT): LEFTWICH-KIRBY, Lattie Haw A   Encounter Date: 01/25/2021   PT End of Session - 01/25/21 1456     Visit Number 1    Date for PT Re-Evaluation 04/05/21    Authorization Type [employer]    PT Start Time 1400    PT Stop Time 9798    PT Time Calculation (min) 45 min    Activity Tolerance Patient tolerated treatment well    Behavior During Therapy [facility] for tasks assessed/performed             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    Hypertropia RIGHT EYE   Seizures ([place]) 2007   Idiopathic   had two seizures (none since)    Past Surgical History:  Procedure Laterality Date   EYE SURGERY  05-01-2005  [facility]   LEFT EYE  AND  1999 BILATERAL EYES 1999   EYE SURGERY     MEDIAN RECTUS REPAIR  08/21/2011   Procedure: MEDIAN RECTUS REPAIR;  Surgeon: Dara Hoyer, MD;  Location: [facility];  Service: Ophthalmology;  Laterality: Right;  inferior oblique myectomy   TYMPANOSTOMY TUBE PLACEMENT  AGE 26   AND ADENOIDECTOMY   WISDOM TOOTH EXTRACTION     ORAL SURGEON OFFICE    There were no vitals filed for this visit.    Subjective Assessment - 01/25/21 1358     Subjective Pt states that she is having severe pain with sit>stand, walking, and general movement; pain is located in lower abdomen and right over pubic bone. She states that pain started a month ago and is getting progressively worse. She is also having trouble with bed moiblity. Pain is sharp. No report urinary/fecal incontinence.    Pertinent History G3P2    Limitations Lifting;Other (comment)   Movement    How long can you sit comfortably? no limitation    How long can you stand comfortably? no limitation    How long can you walk comfortably? 10 minutes, but very slow walking - may start easing up as she goes    Patient Stated Goals To have less pain    Currently in Pain? Yes    Pain Score 9     Pain Location Pelvis    Pain Orientation Right;Left    Pain Descriptors / Indicators Sharp    Pain Onset 1 to 4 weeks ago    Pain Frequency Constant    Aggravating Factors  Bending,dressing, bed mobility, movement    Pain Relieving Factors Sometimes slow walking, not moving    Effect of Pain on Daily Activities Limiting    Multiple Pain Sites No                [facility] PT Assessment - 01/25/21 0001       Assessment   Medical Diagnosis O26.893,R10.2 (ICD-10-CM) - Pelvic pain affecting pregnancy in third trimester, antepartum    Referring Provider (PT) LEFTWICH-KIRBY, LISA A    Onset Date/Surgical Date 12/25/20    Next MD Visit 02/06/21    Prior Therapy no      Precautions   Precautions  None      Restrictions   Weight Bearing Restrictions No      Balance Screen   Has the patient fallen in the past 6 months No    Has the patient had a decrease in activity level because of a fear of falling?  No    Is the patient reluctant to leave their home because of a fear of falling?  No      Home Ecologist residence      Prior Function   Level of Independence Independent    Vocation Unemployed      Cognition   Overall Cognitive Status Within Functional Limits for tasks assessed      Functional Tests   Functional tests Squat;Single Leg Squat      Squat   Comments Limited posterior weight shift with no hip hinge      Single Leg Squat   Comments unable, severe pain and apprehension Bil      Posture/Postural Control   Posture Comments Rt pelvic rotation/iliac crest elevation      ROM / Strength   AROM / PROM / Strength AROM;Strength      AROM    Overall AROM Comments All lumbar A/ROM limited by 50% with pain      Strength   Overall Strength Comments Hip: Bil flexion 3/5 with pain at pubic symphysis, Bil IR 3/5 with pain at pubic symphysis, Bil ER with pain at pubic symphysis when testing Rt, Bil abduction/adduction/extension all 2/5 with significant pain at pubic symphysis      Palpation   Palpation comment pain at pubic symphysis      Special Tests   Other special tests transversus abdominus contraction weak and required extensive cuing to achieve; can only perform with breath holidng at this time      Bed Mobility   Bed Mobility Rolling Right;Rolling Left;Left Sidelying to Sit   Pt demonstrated breath holding and severe pain with all bed mobility with difficulty                       Objective measurements completed on examination: See above findings.     Pelvic Floor Special Questions - 01/25/21 0001     Are you Pregnant or attempting pregnancy? Yes    Prior Pregnancies Yes    Number of Pregnancies 3   No pain with prior pregnancies   Number of C-Sections 0    Number of Vaginal Deliveries 2    Any difficulty with labor and deliveries No    Episiotomy Performed No   tearing with both   Currently Sexually Active No    History of sexually transmitted disease Yes    Urinary Leakage No    Urinary urgency Yes    Urinary frequency every 2 hours    Fecal incontinence No    Fluid intake 8oz    Caffeine beverages no    Falling out feeling (prolapse) No              [facility] Adult PT Treatment/Exercise - 01/25/21 0001       Self-Care   Self-Care Other Self-Care Comments   Bed moiblity and transfer training with abdominal bracing and breath coordination; discussed log roll for improved comfort     Therapeutic Activites    Therapeutic Activities Other Therapeutic Activities    Other Therapeutic Activities Bed mobility and transfer training with abdominal bracing and breath coordination; discussed log roll  for  improved comfort      Neuro Re-ed    Neuro Re-ed Details  transversus abdominus training with VC/TCs - instructed to hug baby girl to help ctivate appropriate muscles      Manual Therapy   Manual Therapy Manual Traction    Manual Traction Bil long axis traction                     PT Education - 01/25/21 1447     Education Details Pt education performed on bed mobility, importance of breathing with movement for pain reduction, SI/pregnancy suppoprt belts and how to look to see if she can get a free one through insurance, and initial HEP. Access Code: [email or web address]    Person(s) Educated Patient    Methods Explanation;Demonstration;Verbal cues;Tactile cues    Comprehension Verbalized understanding              PT Short Term Goals - 01/25/21 1508       PT SHORT TERM GOAL #1   Title Pt will be independent with HEP.    Baseline no HEP    Time 4    Period Weeks    Status New    Target Date 02/22/21      PT SHORT TERM GOAL #2   Title Pt will be able to perform all bed mobility with reduced pain and increased ease in 4 weeks.    Baseline pain with bed mobility    Time 4    Period Weeks    Status New    Target Date 02/22/21      PT SHORT TERM GOAL #3   Title Pt will increase all lumbar A/ROM by 25% in order to improve functional mobility.    Baseline 50% reduction in all lumbar A/ROM    Time 4    Period Weeks    Status New    Target Date 02/22/21               PT Long Term Goals - 01/25/21 1509       PT LONG TERM GOAL #1   Title Pt will be independent with advanced HEP.    Baseline no HEP    Time 10    Period Weeks    Status New    Target Date 04/05/21      PT LONG TERM GOAL #2   Title Pt will increase all impaired hip strength by 1 muscle grade in order to improve ease of bed mobility, increase comfort with ambulation, and perform all functional activities with reduced pain.    Baseline pain with all functoinal activities    Time 10    Period  Weeks    Status New    Target Date 04/05/21      PT LONG TERM GOAL #3   Title Pt will be independent with pain management techniques and use of pelvic support belt.    Time 10    Period Weeks    Status New    Target Date 04/05/21                    Plan - 01/25/21 1456     Clinical Impression Statement Pt is a 26 year old female G3P2 that is [redacted] weeks pregnant with chief complaint of pelvic pain that is limiting functional mobility. Exam fidnings notbale for Rt pelvic rotation, decreased abdominal strength, significant weakness in Bil hips, reduced lumbar mobility, and tenderness to palpation over pubic bone and lower  abdomen. 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 Initial treatment included Bil LE long axis traction and hip isometrics for pain relief, bed mobility training to improve functional ability, and pt education of how to find pelvic support belt; she tolerated well demonstrated by pain reduction to 8/10 from 9/10. She will benefit from skilled PT intervention in order to address impairments, decrease pain, learn pain management techniques, and prepare for delivery.    Personal Factors and Comorbidities Comorbidity 1    Comorbidities 2 vaginal deliveries with tearing    Examination-Activity Limitations Bed Mobility;Bend;Carry;Dressing    Examination-Participation Restrictions Community Activity    Stability/Clinical Decision Making Stable/Uncomplicated    Clinical Decision Making Low    Rehab Potential Fair    PT Frequency 1x / week    PT Duration 12 weeks    PT Treatment/Interventions ADLs/Self Care Home Management;Biofeedback;Cryotherapy;Electrical Stimulation;Moist Heat;Therapeutic activities;Therapeutic exercise;Neuromuscular re-education;Manual techniques;Patient/family education;Scar mobilization;Passive range of motion;Dry needling;Spinal Manipulations    PT Next Visit  Plan Perform pelvic mobilizations and long axis traction for pain relief; isometrics to help improve pelvic stability; gentle strengthening progressions as tolerated.    PT Home Exercise Plan Access Code: [email or web address]    Consulted and Agree with Plan of Care Patient             Patient will benefit from skilled therapeutic intervention in order to improve the following deficits and impairments:  Abnormal gait, Decreased coordination, Decreased range of motion, Difficulty walking, Increased fascial restricitons, Decreased endurance, Increased muscle spasms, Pain, Decreased activity tolerance, Decreased scar mobility, Hypomobility, Impaired flexibility, Improper body mechanics, Postural dysfunction, Decreased strength, Decreased mobility  Visit Diagnosis: Muscle weakness (generalized)  Unspecified lack of coordination  Other muscle spasm     Problem List Patient Active Problem List   Diagnosis Date Noted   Urinary tract infection in pregnancy in second trimester, antepartum 11/26/2020   Nausea/vomiting in pregnancy 09/20/2020   History of seizures 09/20/2020   Encounter for supervision of normal pregnancy in second trimester 08/16/2020   Hypertropia 08/20/2011    Heather Roberts, PT, DPT01/19/233:23 PM   [facility] @ [place], [place], [address] Phone: [phone]   Fax:  [phone]  Name: MASSA PE MRN: [id number] Date of Birth: 21-Feb-1995  PHYSICAL THERAPY DISCHARGE SUMMARY  Visits from Start of Care: 1  Current functional level related to goals / functional outcomes: See above. Pt cancelled due to report of figuring out what was causing pain.   Remaining deficits: See above   Education / Equipment: HEP   Patient agrees to discharge. Patient goals were not met. Patient is being discharged due to the patient's request.  Thank you for your referral.  Heather Roberts, PT, DPT02/01/233:05  PM

## 2021-01-26 LAB — URINE CULTURE, OB REFLEX

## 2021-01-26 LAB — CULTURE, OB URINE

## 2021-01-29 ENCOUNTER — Telehealth: Payer: Self-pay | Admitting: Advanced Practice Midwife

## 2021-01-29 MED ORDER — CEPHALEXIN 500 MG PO CAPS: 500.0000 mg | ORAL_CAPSULE | Freq: Every day | ORAL | 2 refills | Status: DC

## 2021-01-29 MED ORDER — CEFADROXIL 500 MG PO CAPS: 500.0000 mg | ORAL_CAPSULE | Freq: Two times a day (BID) | ORAL | 0 refills | Status: AC

## 2021-01-29 NOTE — Addendum Note (Signed)
Addended by: Sharen Counter A on: 01/29/2021 01:10 PM   Modules accepted: Orders

## 2021-01-29 NOTE — Telephone Encounter (Signed)
Called pt to discuss urine culture results that are positive for UTI with klebsiella pneumoniae.  Rx for Duricef 500 mg BID x 7 days sent to pharmacy. Also, this is third UTI during this pregnancy, so Rx for Keflex 500 mg QHS for prevention of recurrent  UTI sent to pharmacy. Discussed treatment and prevention prescriptions with pt who states understanding.  Will f/u at office visit 1/31.  Call the office before then as needed.

## 2021-02-01 ENCOUNTER — Encounter (HOSPITAL_COMMUNITY): Payer: Self-pay | Admitting: Obstetrics and Gynecology

## 2021-02-01 ENCOUNTER — Other Ambulatory Visit: Payer: Self-pay

## 2021-02-01 ENCOUNTER — Inpatient Hospital Stay (HOSPITAL_COMMUNITY)
Admission: AD | Admit: 2021-02-01 | Discharge: 2021-02-01 | Disposition: A | Discharge status: Home / Self Care | Payer: Medicaid Other | Attending: Obstetrics and Gynecology | Admitting: Obstetrics and Gynecology

## 2021-02-01 DIAGNOSIS — N390 Urinary tract infection, site not specified: Secondary | ICD-10-CM | POA: Diagnosis not present

## 2021-02-01 DIAGNOSIS — Z3A30 30 weeks gestation of pregnancy: Secondary | ICD-10-CM | POA: Insufficient documentation

## 2021-02-01 DIAGNOSIS — R102 Pelvic and perineal pain: Secondary | ICD-10-CM | POA: Diagnosis not present

## 2021-02-01 DIAGNOSIS — O26893 Other specified pregnancy related conditions, third trimester: Secondary | ICD-10-CM | POA: Diagnosis present

## 2021-02-01 DIAGNOSIS — O2343 Unspecified infection of urinary tract in pregnancy, third trimester: Secondary | ICD-10-CM | POA: Insufficient documentation

## 2021-02-01 DIAGNOSIS — Z3689 Encounter for other specified antenatal screening: Secondary | ICD-10-CM | POA: Diagnosis not present

## 2021-02-01 LAB — URINALYSIS, ROUTINE W REFLEX MICROSCOPIC
Bilirubin Urine: NEGATIVE
Glucose, UA: NEGATIVE mg/dL
Hgb urine dipstick: NEGATIVE
Ketones, ur: NEGATIVE mg/dL
Nitrite: POSITIVE — AB
Protein, ur: 30 mg/dL — AB
Specific Gravity, Urine: 1.024 (ref 1.005–1.030)
pH: 7 (ref 5.0–8.0)

## 2021-02-01 LAB — WET PREP, GENITAL
Clue Cells Wet Prep HPF POC: NONE SEEN
Sperm: NONE SEEN
Trich, Wet Prep: NONE SEEN
WBC, Wet Prep HPF POC: >=10 — AB (ref <10)
Yeast Wet Prep HPF POC: NONE SEEN

## 2021-02-01 MED ORDER — ACETAMINOPHEN 500 MG PO TABS
1000.0000 mg | ORAL_TABLET | Freq: Four times a day (QID) | ORAL | Status: DC | PRN
  Administered 2021-02-01: 1000 mg via ORAL
  Filled 2021-02-01: qty 2

## 2021-02-01 MED ORDER — CYCLOBENZAPRINE HCL 5 MG PO TABS
10.0000 mg | ORAL_TABLET | Freq: Three times a day (TID) | ORAL | Status: DC | PRN
  Administered 2021-02-01: 10 mg via ORAL
  Filled 2021-02-01: qty 2

## 2021-02-01 NOTE — MAU Provider Note (Signed)
History     CSN: [id number]  Arrival date and time: 02/01/21 1320   Event Date/Time   First Provider Initiated Contact with Patient 02/01/21 1346      Chief Complaint  Patient presents with   Pelvic Pain   25 y.o. [id number] @30 .6 wks presenting with pelvic pain. She is unsure of onset but thinks it's gotten worse in the 3rd trimester. Describes pain as constant and sharp, worse with standing and walking. Has not tried anything for the pain. Denies urinary sx. Denies VB, vaginal discharge, itching, malodor, LOF, or ctx. Reports good FM. She was recently dx with a third UTI this pregnancy but has not picked up the antibiotics.   OB History     Gravida  3   Para  2   Term  2   Preterm  0   AB  0   Living  2      SAB  0   IAB  0   Ectopic  0   Multiple  0   Live Births  2           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    Hypertropia RIGHT EYE   Seizures ([place]) 2007   Idiopathic   had two seizures (none since)    Past Surgical History:  Procedure Laterality Date   EYE SURGERY  05-01-2005  [facility]   LEFT EYE  AND  1999 BILATERAL EYES 1999   EYE SURGERY     MEDIAN RECTUS REPAIR  08/21/2011   Procedure: MEDIAN RECTUS REPAIR;  Surgeon: Dara Hoyer, MD;  Location: [facility];  Service: Ophthalmology;  Laterality: Right;  inferior oblique myectomy   TYMPANOSTOMY TUBE PLACEMENT  AGE 31   AND ADENOIDECTOMY   WISDOM TOOTH EXTRACTION     ORAL SURGEON OFFICE    Family History  Problem Relation Age of Onset   Cancer Maternal Grandmother        breast   Hypertension Other    Healthy Mother    Healthy Father     Social History   Tobacco Use   Smoking status: Never   Smokeless tobacco: Never  Vaping Use   Vaping Use: Never used  Substance Use Topics   Alcohol use: No   Drug use: No    Allergies: No Known  Allergies  Medications Prior to Admission  Medication Sig Dispense Refill Last Dose   Blood Pressure Monitoring (BLOOD PRESSURE KIT) DEVI 1 kit by Does not apply route once a week. (Patient not taking: No sig reported) 1 each 0    cefadroxil (DURICEF) 500 MG capsule Take 1 capsule (500 mg total) by mouth 2 (two) times daily for 7 days. 14 capsule 0    [START ON 02/05/2021] cephALEXin (KEFLEX) 500 MG capsule Take 1 capsule (500 mg total) by mouth at bedtime. Take Duricef twice per day for 7 days, then start this daily Keflex dose at bedtime. 30 capsule 2    Doxylamine-Pyridoxine (DICLEGIS) 10-10 MG TBEC Take 2 tablets by mouth at bedtime. If symptoms persist, add one tablet in the morning and one in the afternoon (Patient not taking: Reported on 11/15/2020) 100 tablet 5    ferrous sulfate (FERROUSUL) 325 (65 FE) MG tablet Take 1 tablet (325 mg total) by mouth every other day. 30 tablet 3  Misc. Devices (GOJJI WEIGHT SCALE) MISC 1 Device by Does not apply route every 30 (thirty) days. (Patient not taking: No sig reported) 1 each 0    Prenatal Vit-Fe Phos-FA-Omega (VITAFOL GUMMIES) 3.33-0.333-34.8 MG CHEW Chew 3 tablets by mouth daily. (Patient not taking: Reported on 11/15/2020) 90 tablet 11     Review of Systems  Gastrointestinal:  Negative for abdominal pain.  Genitourinary:  Positive for pelvic pain. Negative for dysuria, frequency, hematuria, urgency, vaginal bleeding and vaginal discharge.  Physical Exam   Blood pressure 126/69, pulse 76, temperature 98.7 F (37.1 C), resp. rate 18, last menstrual period 05/28/2020, SpO2 98 %.  Physical Exam Vitals and nursing note reviewed. Exam conducted with a chaperone present.  Constitutional:      General: She is not in acute distress. HENT:     Head: Normocephalic and atraumatic.  Cardiovascular:     Rate and Rhythm: Normal rate.  Pulmonary:     Effort: Pulmonary effort is normal. No respiratory distress.  Abdominal:     Palpations: Abdomen  is soft.     Tenderness: There is no abdominal tenderness.     Comments: gravid  Genitourinary:    Comments: VE: closed/thick Musculoskeletal:        General: Normal range of motion.     Cervical back: Normal range of motion.  Skin:    General: Skin is warm and dry.  Neurological:     General: No focal deficit present.     Mental Status: She is alert and oriented to person, place, and time.  Psychiatric:        Mood and Affect: Mood normal.        Behavior: Behavior normal.  EFM: 135 bpm, mod variability, + accels, no decels Toco: none  Results for orders placed or performed during the hospital encounter of 02/01/21 (from the past 24 hour(s))  Urinalysis, Routine w reflex microscopic Urine, Clean Catch     Status: Abnormal   Collection Time: 02/01/21  1:50 PM  Result Value Ref Range   Color, Urine YELLOW YELLOW   APPearance HAZY (A) CLEAR   Specific Gravity, Urine 1.024 1.005 - 1.030   pH 7.0 5.0 - 8.0   Glucose, UA NEGATIVE NEGATIVE mg/dL   Hgb urine dipstick NEGATIVE NEGATIVE   Bilirubin Urine NEGATIVE NEGATIVE   Ketones, ur NEGATIVE NEGATIVE mg/dL   Protein, ur 30 (A) NEGATIVE mg/dL   Nitrite POSITIVE (A) NEGATIVE   Leukocytes,Ua MODERATE (A) NEGATIVE   RBC / HPF 0-5 0 - 5 RBC/hpf   WBC, UA 21-50 0 - 5 WBC/hpf   Bacteria, UA MANY (A) NONE SEEN   Squamous Epithelial / LPF 6-10 0 - 5   Mucus PRESENT   Wet prep, genital     Status: Abnormal   Collection Time: 02/01/21  1:58 PM  Result Value Ref Range   Yeast Wet Prep HPF POC NONE SEEN NONE SEEN   Trich, Wet Prep NONE SEEN NONE SEEN   Clue Cells Wet Prep HPF POC NONE SEEN NONE SEEN   WBC, Wet Prep HPF POC >=10 (A) <10   Sperm NONE SEEN    MAU Course  Procedures Tylenol Flexeril  MDM Labs ordered and reviewed. UA with nitrites and bacteria. Upon chart review: pt recently dx with 3rd UTI about 1 week ago and reports she hasn't picked up the antibiotics yet. This is likely the cause of her pain and may be compounded  by MSK pain. Discussed importance of starting abx today  as she's at risk for pyelo which could require hospitalization. Stable for discharge home.   Assessment and Plan   1. [redacted] weeks gestation of pregnancy   2. NST (non-stress test) reactive   3. Urinary tract infection in mother during third trimester of pregnancy    Discharge home [facility] as scheduled Return precautions  Allergies as of 02/01/2021   No Known Allergies      Medication List     TAKE these medications    Blood Pressure Kit Devi 1 kit by Does not apply route once a week.   cefadroxil 500 MG capsule Commonly known as: DURICEF Take 1 capsule (500 mg total) by mouth 2 (two) times daily for 7 days.   cephALEXin 500 MG capsule Commonly known as: KEFLEX Take 1 capsule (500 mg total) by mouth at bedtime. Take Duricef twice per day for 7 days, then start this daily Keflex dose at bedtime. Start taking on: February 05, 2021   Doxylamine-Pyridoxine 10-10 MG Tbec Commonly known as: Diclegis Take 2 tablets by mouth at bedtime. If symptoms persist, add one tablet in the morning and one in the afternoon   ferrous sulfate 325 (65 FE) MG tablet Commonly known as: FerrouSul Take 1 tablet (325 mg total) by mouth every other day.   Gojji Weight Scale Misc 1 Device by Does not apply route every 30 (thirty) days.   Vitafol Gummies 3.33-0.333-34.8 MG Chew Chew 3 tablets by mouth daily.       Julianne Handler, CNM 02/01/2021, 2:57 PM

## 2021-02-01 NOTE — MAU Note (Signed)
Pt reports ongoing pelvic pain throughout her pregnancy. Pt reports she believes it has worsened this trimester.   Denies vaginal bleeding or LOF.   Reports +FM

## 2021-02-02 LAB — GC/CHLAMYDIA PROBE AMP (~~LOC~~) NOT AT ARMC
Chlamydia: NEGATIVE
Comment: NEGATIVE
Comment: NORMAL
Neisseria Gonorrhea: NEGATIVE

## 2021-02-03 LAB — CULTURE, OB URINE: Culture: >=100000 — AB

## 2021-02-06 ENCOUNTER — Other Ambulatory Visit: Payer: Self-pay

## 2021-02-06 ENCOUNTER — Ambulatory Visit: Payer: Medicaid Other

## 2021-02-06 ENCOUNTER — Ambulatory Visit (INDEPENDENT_AMBULATORY_CARE_PROVIDER_SITE_OTHER): Payer: Medicaid Other | Admitting: Advanced Practice Midwife

## 2021-02-06 VITALS — BP 120/74 | HR 93 | Wt 252.8 lb

## 2021-02-06 DIAGNOSIS — Z3482 Encounter for supervision of other normal pregnancy, second trimester: Secondary | ICD-10-CM

## 2021-02-06 DIAGNOSIS — O99213 Obesity complicating pregnancy, third trimester: Secondary | ICD-10-CM

## 2021-02-06 DIAGNOSIS — Z3A31 31 weeks gestation of pregnancy: Secondary | ICD-10-CM

## 2021-02-06 NOTE — Progress Notes (Signed)
Pt presents for ROB, reports some swelling in lower extremities. No other concerns. Sharlyne Pacas, RN

## 2021-02-06 NOTE — Progress Notes (Signed)
° °  PRENATAL VISIT NOTE  Subjective:  Kristi Novak is a 26 y.o. G3P2002 at 104w4d being seen today for ongoing prenatal care.  She is currently monitored for the following issues for this low-risk pregnancy and has Hypertropia; Encounter for supervision of normal pregnancy in second trimester; Nausea/vomiting in pregnancy; History of seizures; and Urinary tract infection in pregnancy in second trimester, antepartum on their problem list.  Patient reports no complaints.  Contractions: Not present. Vag. Bleeding: None.   . Denies leaking of fluid.   The following portions of the patient's history were reviewed and updated as appropriate: allergies, current medications, past family history, past medical history, past social history, past surgical history and problem list.   Objective:   Vitals:   02/06/21 1621  BP: 120/74  Pulse: 93  Weight: 252 lb 12.8 oz (114.7 kg)    Fetal Status: Fetal Heart Rate (bpm): 140 Fundal Height: 33 cm       General:  Alert, oriented and cooperative. Patient is in no acute distress.  Skin: Skin is warm and dry. No rash noted.   Cardiovascular: Normal heart rate noted  Respiratory: Normal respiratory effort, no problems with respiration noted  Abdomen: Soft, gravid, appropriate for gestational age.  Pain/Pressure: Absent     Pelvic: Cervical exam deferred        Extremities: Normal range of motion.  Edema: Mild pitting, slight indentation  Mental Status: Normal mood and affect. Normal behavior. Normal judgment and thought content.   Assessment and Plan:  Pregnancy: G3P2002 at [redacted]w[redacted]d 1. Encounter for supervision of other normal pregnancy in second trimester --Anticipatory guidance about next visits/weeks of pregnancy given.   2. [redacted] weeks gestation of pregnancy   3. Obesity affecting pregnancy in third trimester --Growth [place] - [place] MFM OB FOLLOW UP; Future   Preterm labor symptoms and general obstetric precautions including but not limited to vaginal  bleeding, contractions, leaking of fluid and fetal movement were reviewed in detail with the patient. Please refer to After Visit Summary for other counseling recommendations.   Return in about 2 weeks (around 02/20/2021).  Future Appointments  Date Time Provider Department Center  02/20/2021  4:10 PM [place], Wilmer Floor, CNM [facility] None    Sharen Counter, CNM

## 2021-02-07 ENCOUNTER — Telehealth: Payer: Self-pay | Admitting: Advanced Practice Midwife

## 2021-02-07 NOTE — Telephone Encounter (Signed)
LVM

## 2021-02-13 ENCOUNTER — Ambulatory Visit: Payer: Medicaid Other | Attending: Advanced Practice Midwife

## 2021-02-13 ENCOUNTER — Encounter: Payer: Self-pay | Admitting: Advanced Practice Midwife

## 2021-02-13 ENCOUNTER — Ambulatory Visit: Payer: Medicaid Other | Admitting: *Deleted

## 2021-02-13 ENCOUNTER — Other Ambulatory Visit: Payer: Self-pay

## 2021-02-13 ENCOUNTER — Other Ambulatory Visit: Payer: Self-pay | Admitting: Advanced Practice Midwife

## 2021-02-13 VITALS — BP 107/64 | HR 87

## 2021-02-13 DIAGNOSIS — Z3482 Encounter for supervision of other normal pregnancy, second trimester: Secondary | ICD-10-CM

## 2021-02-13 DIAGNOSIS — O99213 Obesity complicating pregnancy, third trimester: Secondary | ICD-10-CM

## 2021-02-13 DIAGNOSIS — Z3A32 32 weeks gestation of pregnancy: Secondary | ICD-10-CM | POA: Diagnosis not present

## 2021-02-13 DIAGNOSIS — O2342 Unspecified infection of urinary tract in pregnancy, second trimester: Secondary | ICD-10-CM | POA: Insufficient documentation

## 2021-02-13 DIAGNOSIS — E668 Other obesity: Secondary | ICD-10-CM

## 2021-02-13 DIAGNOSIS — O321XX0 Maternal care for breech presentation, not applicable or unspecified: Secondary | ICD-10-CM | POA: Insufficient documentation

## 2021-02-13 DIAGNOSIS — O365990 Maternal care for other known or suspected poor fetal growth, unspecified trimester, not applicable or unspecified: Secondary | ICD-10-CM | POA: Insufficient documentation

## 2021-02-14 ENCOUNTER — Other Ambulatory Visit: Payer: Self-pay | Admitting: *Deleted

## 2021-02-14 DIAGNOSIS — O365990 Maternal care for other known or suspected poor fetal growth, unspecified trimester, not applicable or unspecified: Secondary | ICD-10-CM

## 2021-02-14 DIAGNOSIS — Z6837 Body mass index (BMI) 37.0-37.9, adult: Secondary | ICD-10-CM

## 2021-02-20 ENCOUNTER — Encounter: Payer: Self-pay | Admitting: Advanced Practice Midwife

## 2021-02-20 ENCOUNTER — Ambulatory Visit (INDEPENDENT_AMBULATORY_CARE_PROVIDER_SITE_OTHER): Payer: Medicaid Other | Admitting: Advanced Practice Midwife

## 2021-02-20 ENCOUNTER — Other Ambulatory Visit: Payer: Self-pay

## 2021-02-20 VITALS — BP 123/76 | HR 90 | Wt 253.0 lb

## 2021-02-20 DIAGNOSIS — O365990 Maternal care for other known or suspected poor fetal growth, unspecified trimester, not applicable or unspecified: Secondary | ICD-10-CM

## 2021-02-20 DIAGNOSIS — Z3482 Encounter for supervision of other normal pregnancy, second trimester: Secondary | ICD-10-CM

## 2021-02-20 DIAGNOSIS — Z3A33 33 weeks gestation of pregnancy: Secondary | ICD-10-CM

## 2021-02-20 DIAGNOSIS — O321XX0 Maternal care for breech presentation, not applicable or unspecified: Secondary | ICD-10-CM

## 2021-02-20 NOTE — Progress Notes (Signed)
° °  PRENATAL VISIT NOTE  Subjective:  Kristi Novak is a 26 y.o. G3P2002 at [redacted]w[redacted]d being seen today for ongoing prenatal care.  She is currently monitored for the following issues for this high-risk pregnancy and has Hypertropia; Encounter for supervision of normal pregnancy in second trimester; Nausea/vomiting in pregnancy; History of seizures; Urinary tract infection in pregnancy in second trimester, antepartum; and Fetal growth restriction antepartum on their problem list.  Patient reports no complaints.  Contractions: Not present. Vag. Bleeding: None.  Movement: Present. Denies leaking of fluid.   The following portions of the patient's history were reviewed and updated as appropriate: allergies, current medications, past family history, past medical history, past social history, past surgical history and problem list.   Objective:   Vitals:   02/20/21 1541  BP: 123/76  Pulse: 90  Weight: 253 lb (114.8 kg)    Fetal Status: Fetal Heart Rate (bpm): 140   Movement: Present     General:  Alert, oriented and cooperative. Patient is in no acute distress.  Skin: Skin is warm and dry. No rash noted.   Cardiovascular: Normal heart rate noted  Respiratory: Normal respiratory effort, no problems with respiration noted  Abdomen: Soft, gravid, appropriate for gestational age.  Pain/Pressure: Absent     Pelvic: Cervical exam deferred        Extremities: Normal range of motion.  Edema: Trace  Mental Status: Normal mood and affect. Normal behavior. Normal judgment and thought content.   Assessment and Plan:  Pregnancy: G3P2002 at [redacted]w[redacted]d 1. Encounter for supervision of other normal pregnancy in second trimester --Anticipatory guidance about next visits/weeks of pregnancy given.  --Next visit in 2 weeks for GBS, likely IOL at 37-38 weeks with FGR  2. Fetal growth restriction antepartum --FGR, EFW 14% but AC 9% --Next [place] on Tuesday, 2/21  3. [redacted] weeks gestation of pregnancy   4. Breech  presentation, single or unspecified fetus --Breech on recent US and on [place] today, confirmed with bedside US.  --Reviewed safe things to try to turn breech baby including maternal positions, using cold on abdomen, sound/light, swimming, and/or chiropractic/accupuncture.  Also discussed risks/benefits of external version.  Recommend primary C/S if onset of labor and fetus remains in breech position.  Pt states understanding.  --Too early to be concerned, reassurance provided to pt that we will continue to assess  Preterm labor symptoms and general obstetric precautions including but not limited to vaginal bleeding, contractions, leaking of fluid and fetal movement were reviewed in detail with the patient. Please refer to After Visit Summary for other counseling recommendations.   No follow-ups on file.  Future Appointments  Date Time Provider [place]  02/27/2021  3:00 PM [facility] NURSE [facility]  02/27/2021  3:15 PM [facility] US2 [facility]  03/06/2021 11:15 AM Shelly Bombard, MD [facility] None  03/06/2021  1:45 PM [facility] NURSE [facility]  03/06/2021  2:00 PM [facility] US1 [facility]  03/13/2021  3:15 PM [facility] NURSE [facility]  03/13/2021  3:30 PM [facility] US3 [facility] [place]    Fatima Blank, CNM

## 2021-02-27 ENCOUNTER — Ambulatory Visit: Payer: Medicaid Other | Admitting: *Deleted

## 2021-02-27 ENCOUNTER — Ambulatory Visit: Payer: Medicaid Other | Attending: Obstetrics

## 2021-02-27 ENCOUNTER — Other Ambulatory Visit: Payer: Self-pay

## 2021-02-27 VITALS — BP 101/63 | HR 99

## 2021-02-27 DIAGNOSIS — O321XX0 Maternal care for breech presentation, not applicable or unspecified: Secondary | ICD-10-CM | POA: Diagnosis not present

## 2021-02-27 DIAGNOSIS — O365990 Maternal care for other known or suspected poor fetal growth, unspecified trimester, not applicable or unspecified: Secondary | ICD-10-CM

## 2021-02-27 DIAGNOSIS — O365930 Maternal care for other known or suspected poor fetal growth, third trimester, not applicable or unspecified: Secondary | ICD-10-CM

## 2021-02-27 DIAGNOSIS — Z3482 Encounter for supervision of other normal pregnancy, second trimester: Secondary | ICD-10-CM

## 2021-02-27 DIAGNOSIS — O99213 Obesity complicating pregnancy, third trimester: Secondary | ICD-10-CM | POA: Diagnosis not present

## 2021-02-27 DIAGNOSIS — Z3A34 34 weeks gestation of pregnancy: Secondary | ICD-10-CM

## 2021-02-27 DIAGNOSIS — O2342 Unspecified infection of urinary tract in pregnancy, second trimester: Secondary | ICD-10-CM | POA: Insufficient documentation

## 2021-02-27 DIAGNOSIS — Z6837 Body mass index (BMI) 37.0-37.9, adult: Secondary | ICD-10-CM

## 2021-03-06 ENCOUNTER — Ambulatory Visit: Payer: Medicaid Other | Admitting: *Deleted

## 2021-03-06 ENCOUNTER — Ambulatory Visit: Payer: Medicaid Other | Attending: Obstetrics

## 2021-03-06 ENCOUNTER — Ambulatory Visit (INDEPENDENT_AMBULATORY_CARE_PROVIDER_SITE_OTHER): Payer: Medicaid Other | Admitting: Obstetrics

## 2021-03-06 ENCOUNTER — Encounter: Payer: Self-pay | Admitting: Obstetrics

## 2021-03-06 ENCOUNTER — Other Ambulatory Visit: Payer: Self-pay

## 2021-03-06 VITALS — BP 97/62 | HR 107

## 2021-03-06 VITALS — BP 130/75 | HR 116 | Wt 257.1 lb

## 2021-03-06 DIAGNOSIS — Z362 Encounter for other antenatal screening follow-up: Secondary | ICD-10-CM | POA: Diagnosis not present

## 2021-03-06 DIAGNOSIS — O2342 Unspecified infection of urinary tract in pregnancy, second trimester: Secondary | ICD-10-CM

## 2021-03-06 DIAGNOSIS — Z6837 Body mass index (BMI) 37.0-37.9, adult: Secondary | ICD-10-CM

## 2021-03-06 DIAGNOSIS — O99213 Obesity complicating pregnancy, third trimester: Secondary | ICD-10-CM | POA: Insufficient documentation

## 2021-03-06 DIAGNOSIS — O365930 Maternal care for other known or suspected poor fetal growth, third trimester, not applicable or unspecified: Secondary | ICD-10-CM

## 2021-03-06 DIAGNOSIS — O321XX0 Maternal care for breech presentation, not applicable or unspecified: Secondary | ICD-10-CM | POA: Insufficient documentation

## 2021-03-06 DIAGNOSIS — Z3A35 35 weeks gestation of pregnancy: Secondary | ICD-10-CM

## 2021-03-06 DIAGNOSIS — Z3482 Encounter for supervision of other normal pregnancy, second trimester: Secondary | ICD-10-CM

## 2021-03-06 DIAGNOSIS — O365990 Maternal care for other known or suspected poor fetal growth, unspecified trimester, not applicable or unspecified: Secondary | ICD-10-CM

## 2021-03-06 DIAGNOSIS — O0990 Supervision of high risk pregnancy, unspecified, unspecified trimester: Secondary | ICD-10-CM

## 2021-03-06 NOTE — Progress Notes (Signed)
Subjective:  Kristi Novak is a 26 y.o. G3P2002 at [redacted]w[redacted]d being seen today for ongoing prenatal care.  She is currently monitored for the following issues for this high-risk pregnancy and has Hypertropia; Encounter for supervision of normal pregnancy in second trimester; Nausea/vomiting in pregnancy; History of seizures; Urinary tract infection in pregnancy in second trimester, antepartum; and Fetal growth restriction antepartum on their problem list.  Patient reports no complaints.  Contractions: Not present. Vag. Bleeding: None.  Movement: Present. Denies leaking of fluid.   The following portions of the patient's history were reviewed and updated as appropriate: allergies, current medications, past family history, past medical history, past social history, past surgical history and problem list. Problem list updated.  Objective:   Vitals:   03/06/21 1107  BP: 130/75  Pulse: (!) 116  Weight: 257 lb 1.6 oz (116.6 kg)    Fetal Status: Fetal Heart Rate (bpm): 140   Movement: Present     General:  Alert, oriented and cooperative. Patient is in no acute distress.  Skin: Skin is warm and dry. No rash noted.   Cardiovascular: Normal heart rate noted  Respiratory: Normal respiratory effort, no problems with respiration noted  Abdomen: Soft, gravid, appropriate for gestational age. Pain/Pressure: Present     Pelvic:  Cervical exam deferred        Extremities: Normal range of motion.  Edema: Trace  Mental Status: Normal mood and affect. Normal behavior. Normal judgment and thought content.   Urinalysis:       [place] MFM UA CORD DOPPLER (Accession [id number]) (Order [id number]) Imaging Date: 02/27/2021 Department: [facility] Released By: Claudius Sis Authorizing: Ulice Dash, MD   Exam Status  Status  Final [99]   PACS Intelerad Image Link   Show images for [place] MFM UA CORD DOPPLER Study Result  Narrative & Impression   ----------------------------------------------------------------------  OBSTETRICS REPORT                       (Signed Final 02/27/2021 04:49 pm) ---------------------------------------------------------------------- Patient Info  ID #:       [id number]                          D.O.B.:  1995/04/05 (25 yrs)  Name:       Kristi Novak                 Visit Date: 02/27/2021 03:33 pm ---------------------------------------------------------------------- Performed By  Attending:        Johnell Comings MD         Ref. Address:     [facility]  Performed By:     [place]     [facility]                                                             [place] [facility]                                                             [facility]  Referred By:      Griffin Basil MD ---------------------------------------------------------------------- Orders  #  Description                           Code        Ordered By  1  [place] MFM FETAL BPP WO NON               76819.01    YU FANG     STRESS  2  [place] MFM UA CORD DOPPLER                76820.02    YU FANG ----------------------------------------------------------------------  #  Order #                     Accession #                Episode #  1  [id number]                   [id number]                 [id number]  2  [id number]                   [id number]                 [id number] ---------------------------------------------------------------------- Indications  Obesity complicating pregnancy, third          O99.213  trimester (BMI 36)  Small for gestational age fetus affecting      O86.5990  management of mother  LR NIPS  [redacted] weeks gestation of pregnancy                 Z3A.34 ---------------------------------------------------------------------- Fetal Evaluation  Num Of Fetuses:         1  Fetal Heart Rate(bpm):  140  Cardiac Activity:       Observed  Presentation:           Breech  Placenta:               Posterior  P. Cord Insertion:      Previously Visualized  Amniotic Fluid  AFI FV:      Within normal limits  AFI Sum(cm)     %  Tile       Largest Pocket(cm)  13.31           44          5.38  RUQ(cm)       RLQ(cm)       LUQ(cm)        LLQ(cm)  0             2.55          5.38           5.38 ---------------------------------------------------------------------- Biophysical Evaluation  Amniotic F.V:   Pocket => 2 cm             F. Tone:        Observed  F. Movement:    Observed                   Score:          8/8  F. Breathing:   Observed ---------------------------------------------------------------------- Biometry  LV:        2.9  mm ---------------------------------------------------------------------- Gestational Age  Best:          34w 4d     Det. By:  U/S C R L  (08/16/20)    EDD:   04/06/21 ---------------------------------------------------------------------- Anatomy  Cranium:               Appears normal         LVOT:                   Previously seen  Cavum:                 Appears normal         Aortic Arch:            Previously seen  Ventricles:            Appears normal         Ductal Arch:            Previously seen  Choroid Plexus:        Previously seen        Diaphragm:              Previously seen  Cerebellum:            Previously seen        Stomach:                Appears normal, left                                                                        sided  Posterior Fossa:       Previously seen        Abdomen:                Previously seen  Nuchal Fold:           Previously seen        Abdominal Wall:         Previously seen  Face:                  Orbits and profile     Cord Vessels:  Appears normal (3                          previously seen                                vessel cord)  Lips:                  Previously seen        Kidneys:                Appear normal  Palate:                Previously seen        Bladder:                Appears normal  Thoracic:              Previously seen        Spine:                  Previously seen  Heart:                 Previously seen        Upper Extremities:      Previously seen  RVOT:                  Previously seen        Lower Extremities:      Previously seen  Other:  Female gender previously seen. Nasal bone, lenses, heels/feet, open          hands/5th digits, VC, 3VV and 3VTV previously visualized.          Technically difficult due to fetal position. ---------------------------------------------------------------------- Doppler - Fetal Vessels  Umbilical Artery   S/D     %tile      RI    %tile      PI    %tile            ADFV    RDFV    2.2       30    0.55       33    0.77       32               No      No ---------------------------------------------------------------------- Cervix Uterus Adnexa  Cervix  Not visualized (advanced GA >24wks)  Uterus  Normal shape and size.  Right Ovary  Within normal limits.  Left Ovary  Not visualized.  Cul De Sac  No free fluid seen.  Adnexa  No adnexal mass visualized. ---------------------------------------------------------------------- Comments  This patient was seen due to maternal obesity and a  borderline IUGR fetus.  She denies any problems since her  last exam.  She reports feeling vigorous fetal movements  throughout the day.  A biophysical profile performed today was 8 out of 8.  There was normal amniotic fluid noted on today's ultrasound  exam.  Doppler studies of the umbilical arteries showed a normal  S/D ratio of 2.2.  There were no signs of absent or reversed  end-diastolic flow noted today.  She will return in 1 week for another growth scan and  BPP. ----------------------------------------------------------------------                   Ma Rings, MD Electronically Signed Final Report  02/27/2021 04:49 pm ----------------------------------------------------------------------    Assessment and Plan:  Pregnancy: G3P2002 at [redacted]w[redacted]d  1. Supervision of high risk pregnancy, antepartum  2. Fetal growth restriction antepartum - follow up ultrasound for growth today  3. Urinary tract infection in pregnancy in second trimester, antepartum, treated, taking suppressive Keflex daily until delivery  - repeat urine culture at 36 weeks   Preterm labor symptoms and general obstetric precautions including but not limited to vaginal bleeding, contractions, leaking of fluid and fetal movement were reviewed in detail with the patient. Please refer to After Visit Summary for other counseling recommendations.   Return in about 1 week (around 03/13/2021) for [facility].   Shelly Bombard, MD  03/06/21

## 2021-03-06 NOTE — Progress Notes (Signed)
Pt presents for for ROB visit. She has no concerns today.

## 2021-03-13 ENCOUNTER — Ambulatory Visit: Payer: Medicaid Other | Admitting: *Deleted

## 2021-03-13 ENCOUNTER — Ambulatory Visit (INDEPENDENT_AMBULATORY_CARE_PROVIDER_SITE_OTHER): Payer: Medicaid Other | Admitting: Obstetrics & Gynecology

## 2021-03-13 ENCOUNTER — Other Ambulatory Visit (HOSPITAL_COMMUNITY)
Admission: RE | Admit: 2021-03-13 | Discharge: 2021-03-13 | Disposition: A | Discharge status: Home / Self Care | Payer: Medicaid Other | Source: Ambulatory Visit | Attending: Obstetrics & Gynecology | Admitting: Obstetrics & Gynecology

## 2021-03-13 ENCOUNTER — Encounter: Payer: Self-pay | Admitting: *Deleted

## 2021-03-13 ENCOUNTER — Ambulatory Visit: Payer: Medicaid Other | Attending: Obstetrics

## 2021-03-13 ENCOUNTER — Other Ambulatory Visit: Payer: Self-pay

## 2021-03-13 VITALS — BP 101/66 | HR 107

## 2021-03-13 VITALS — BP 110/72 | HR 105 | Wt 257.0 lb

## 2021-03-13 DIAGNOSIS — Z6837 Body mass index (BMI) 37.0-37.9, adult: Secondary | ICD-10-CM | POA: Insufficient documentation

## 2021-03-13 DIAGNOSIS — O365930 Maternal care for other known or suspected poor fetal growth, third trimester, not applicable or unspecified: Secondary | ICD-10-CM

## 2021-03-13 DIAGNOSIS — Z3A36 36 weeks gestation of pregnancy: Secondary | ICD-10-CM

## 2021-03-13 DIAGNOSIS — Z362 Encounter for other antenatal screening follow-up: Secondary | ICD-10-CM

## 2021-03-13 DIAGNOSIS — Z3482 Encounter for supervision of other normal pregnancy, second trimester: Secondary | ICD-10-CM | POA: Diagnosis not present

## 2021-03-13 DIAGNOSIS — O365990 Maternal care for other known or suspected poor fetal growth, unspecified trimester, not applicable or unspecified: Secondary | ICD-10-CM

## 2021-03-13 DIAGNOSIS — O99213 Obesity complicating pregnancy, third trimester: Secondary | ICD-10-CM

## 2021-03-13 LAB — OB RESULTS CONSOLE GC/CHLAMYDIA: Gonorrhea: NEGATIVE

## 2021-03-13 NOTE — Progress Notes (Signed)
ROB/GBS. Reports no problems today. 

## 2021-03-13 NOTE — Progress Notes (Signed)
? ?  PRENATAL VISIT NOTE ? ?Subjective:  ?Kristi Novak is a 26 y.o. G3P2002 at [redacted]w[redacted]d being seen today for ongoing prenatal care.  She is currently monitored for the following issues for this high-risk pregnancy and has Hypertropia; Encounter for supervision of normal pregnancy in second trimester; Nausea/vomiting in pregnancy; History of seizures; Urinary tract infection in pregnancy in second trimester, antepartum; and Fetal growth restriction antepartum on their problem list. ? ?Patient reports occasional contractions.  Contractions: Not present. Vag. Bleeding: None.  Movement: Present. Denies leaking of fluid.  ? ?The following portions of the patient's history were reviewed and updated as appropriate: allergies, current medications, past family history, past medical history, past social history, past surgical history and problem list.  ? ?Objective:  ? ?Vitals:  ? 03/13/21 1344  ?BP: 110/72  ?Pulse: (!) 105  ?Weight: 257 lb (116.6 kg)  ? ? ?Fetal Status: Fetal Heart Rate (bpm): 144   Movement: Present  Presentation: Vertex ? ?General:  Alert, oriented and cooperative. Patient is in no acute distress.  ?Skin: Skin is warm and dry. No rash noted.   ?Cardiovascular: Normal heart rate noted  ?Respiratory: Normal respiratory effort, no problems with respiration noted  ?Abdomen: Soft, gravid, appropriate for gestational age.  Pain/Pressure: Absent     ?Pelvic: Cervical exam performed in the presence of a chaperone Dilation: 2 Effacement (%): 30 Station: Ballotable  ?Extremities: Normal range of motion.  Edema: Trace  ?Mental Status: Normal mood and affect. Normal behavior. Normal judgment and thought content.  ? ?Assessment and Plan:  ?Pregnancy: [id number] at [redacted]w[redacted]d ?1. Encounter for supervision of other normal pregnancy in second trimester ?Routine 36 weeks ?- Culture, beta strep (group b only) ?- Cervicovaginal ancillary only( [place]) ? ?2. Fetal growth restriction antepartum ?15%ile last [place] no current plan for  early IOL ? ?Preterm labor symptoms and general obstetric precautions including but not limited to vaginal bleeding, contractions, leaking of fluid and fetal movement were reviewed in detail with the patient. ?Please refer to After Visit Summary for other counseling recommendations.  ? ?Return in about 1 week (around 03/20/2021). ? ?Future Appointments  ?Date Time Provider [place]  ?03/13/2021  3:15 PM [facility] NURSE [facility]  ?03/13/2021  3:30 PM [facility] US3 [facility]  ?03/20/2021  1:50 PM Woodroe Mode, MD [facility] None  ?03/27/2021  1:50 PM Griffin Basil, MD [facility] None  ?04/03/2021  1:50 PM Woodroe Mode, MD [facility] None  ? ? ?Emeterio Reeve, MD ? ?

## 2021-03-14 ENCOUNTER — Other Ambulatory Visit: Payer: Self-pay | Admitting: *Deleted

## 2021-03-14 DIAGNOSIS — R638 Other symptoms and signs concerning food and fluid intake: Secondary | ICD-10-CM

## 2021-03-14 DIAGNOSIS — O365990 Maternal care for other known or suspected poor fetal growth, unspecified trimester, not applicable or unspecified: Secondary | ICD-10-CM

## 2021-03-14 LAB — CERVICOVAGINAL ANCILLARY ONLY
Chlamydia: NEGATIVE
Comment: NEGATIVE
Comment: NORMAL
Neisseria Gonorrhea: NEGATIVE

## 2021-03-16 ENCOUNTER — Telehealth: Payer: Self-pay

## 2021-03-17 LAB — CULTURE, BETA STREP (GROUP B ONLY): Strep Gp B Culture: NEGATIVE

## 2021-03-20 ENCOUNTER — Ambulatory Visit: Payer: Medicaid Other

## 2021-03-20 ENCOUNTER — Encounter: Payer: Medicaid Other | Admitting: Obstetrics & Gynecology

## 2021-03-20 ENCOUNTER — Ambulatory Visit (HOSPITAL_BASED_OUTPATIENT_CLINIC_OR_DEPARTMENT_OTHER): Payer: Medicaid Other

## 2021-03-20 ENCOUNTER — Other Ambulatory Visit: Payer: Self-pay

## 2021-03-20 ENCOUNTER — Ambulatory Visit (INDEPENDENT_AMBULATORY_CARE_PROVIDER_SITE_OTHER): Payer: Medicaid Other | Admitting: Obstetrics & Gynecology

## 2021-03-20 ENCOUNTER — Ambulatory Visit: Payer: Medicaid Other | Attending: Obstetrics | Admitting: *Deleted

## 2021-03-20 VITALS — BP 110/72 | HR 91 | Wt 256.8 lb

## 2021-03-20 VITALS — BP 110/64 | HR 87

## 2021-03-20 DIAGNOSIS — O26893 Other specified pregnancy related conditions, third trimester: Secondary | ICD-10-CM | POA: Insufficient documentation

## 2021-03-20 DIAGNOSIS — O365990 Maternal care for other known or suspected poor fetal growth, unspecified trimester, not applicable or unspecified: Secondary | ICD-10-CM

## 2021-03-20 DIAGNOSIS — O99213 Obesity complicating pregnancy, third trimester: Secondary | ICD-10-CM | POA: Insufficient documentation

## 2021-03-20 DIAGNOSIS — O365930 Maternal care for other known or suspected poor fetal growth, third trimester, not applicable or unspecified: Secondary | ICD-10-CM

## 2021-03-20 DIAGNOSIS — E669 Obesity, unspecified: Secondary | ICD-10-CM | POA: Insufficient documentation

## 2021-03-20 DIAGNOSIS — Z3A37 37 weeks gestation of pregnancy: Secondary | ICD-10-CM

## 2021-03-20 DIAGNOSIS — Z3482 Encounter for supervision of other normal pregnancy, second trimester: Secondary | ICD-10-CM

## 2021-03-20 DIAGNOSIS — O2342 Unspecified infection of urinary tract in pregnancy, second trimester: Secondary | ICD-10-CM

## 2021-03-20 DIAGNOSIS — R638 Other symptoms and signs concerning food and fluid intake: Secondary | ICD-10-CM

## 2021-03-20 NOTE — Progress Notes (Signed)
Patient presents for ROB. Patient states that she did have some bloody show this past weekend. She states that she called the after hours nurse for reassurance. No bleeding at this time. Patient has no other concerns. ?

## 2021-03-20 NOTE — Progress Notes (Signed)
? ?  PRENATAL VISIT NOTE ? ?Subjective:  ?Kristi Novak is a 26 y.o. G3P2002 at [redacted]w[redacted]d being seen today for ongoing prenatal care.  She is currently monitored for the following issues for this high-risk pregnancy and has Hypertropia; Encounter for supervision of normal pregnancy in second trimester; Nausea/vomiting in pregnancy; History of seizures; Urinary tract infection in pregnancy in second trimester, antepartum; and Fetal growth restriction antepartum on their problem list. ? ?Patient reports no complaints.  Contractions: Not present. Vag. Bleeding: None.  Movement: Present. Denies leaking of fluid.  ? ?The following portions of the patient's history were reviewed and updated as appropriate: allergies, current medications, past family history, past medical history, past social history, past surgical history and problem list.  ? ?Objective:  ? ?Vitals:  ? 03/20/21 1450  ?BP: 110/72  ?Pulse: 91  ?Weight: 256 lb 12.8 oz (116.5 kg)  ? ? ?Fetal Status: Fetal Heart Rate (bpm): 142   Movement: Present    ? ?General:  Alert, oriented and cooperative. Patient is in no acute distress.  ?Skin: Skin is warm and dry. No rash noted.   ?Cardiovascular: Normal heart rate noted  ?Respiratory: Normal respiratory effort, no problems with respiration noted  ?Abdomen: Soft, gravid, appropriate for gestational age.  Pain/Pressure: Present     ?Pelvic: Cervical exam deferred        ?Extremities: Normal range of motion.  Edema: Trace  ?Mental Status: Normal mood and affect. Normal behavior. Normal judgment and thought content.  ? ?Assessment and Plan:  ?Pregnancy: [id number] at [redacted]w[redacted]d ?1. Fetal growth restriction antepartum ?Borderline, MFM states IOL after 38 weeks patient requests 3/17 ? ?2. Encounter for supervision of other normal pregnancy in second trimester ?Third trimester ? ?Preterm labor symptoms and general obstetric precautions including but not limited to vaginal bleeding, contractions, leaking of fluid and fetal movement  were reviewed in detail with the patient. ?Please refer to After Visit Summary for other counseling recommendations.  ? ?Return if symptoms worsen or fail to improve, for postpartum. ? ?Future Appointments  ?Date Time Provider [place]  ?03/20/2021  3:30 PM [facility] NURSE [facility]  ?03/20/2021  3:45 PM [facility] US1 [facility]  ?03/27/2021 11:15 AM Griffin Basil, MD [facility] None  ?03/27/2021  1:45 PM [facility] NURSE [facility]  ?03/27/2021  2:00 PM [facility] US1 [facility]  ?04/03/2021  1:50 PM Woodroe Mode, MD [facility] None  ? ? ?Emeterio Reeve, MD ? ?

## 2021-03-21 ENCOUNTER — Encounter (HOSPITAL_COMMUNITY): Payer: Self-pay | Admitting: Obstetrics and Gynecology

## 2021-03-21 ENCOUNTER — Other Ambulatory Visit: Payer: Self-pay | Admitting: Advanced Practice Midwife

## 2021-03-21 ENCOUNTER — Telehealth (HOSPITAL_COMMUNITY): Payer: Self-pay | Admitting: *Deleted

## 2021-03-21 ENCOUNTER — Other Ambulatory Visit: Payer: Self-pay | Admitting: *Deleted

## 2021-03-21 DIAGNOSIS — O365930 Maternal care for other known or suspected poor fetal growth, third trimester, not applicable or unspecified: Secondary | ICD-10-CM

## 2021-03-21 DIAGNOSIS — R638 Other symptoms and signs concerning food and fluid intake: Secondary | ICD-10-CM

## 2021-03-21 DIAGNOSIS — Z3689 Encounter for other specified antenatal screening: Secondary | ICD-10-CM

## 2021-03-21 NOTE — Telephone Encounter (Signed)
Preadmission screen  

## 2021-03-23 ENCOUNTER — Inpatient Hospital Stay (HOSPITAL_COMMUNITY)
Admission: AD | Admit: 2021-03-23 | Discharge: 2021-03-24 | DRG: 807 | Disposition: A | Discharge status: Home / Self Care | Payer: Medicaid Other | Attending: Family Medicine | Admitting: Family Medicine

## 2021-03-23 ENCOUNTER — Inpatient Hospital Stay (HOSPITAL_COMMUNITY): Payer: Medicaid Other

## 2021-03-23 ENCOUNTER — Encounter (HOSPITAL_COMMUNITY): Payer: Self-pay | Admitting: Family Medicine

## 2021-03-23 ENCOUNTER — Other Ambulatory Visit: Payer: Self-pay

## 2021-03-23 ENCOUNTER — Inpatient Hospital Stay (HOSPITAL_COMMUNITY): Payer: Medicaid Other | Admitting: Anesthesiology

## 2021-03-23 DIAGNOSIS — Z30017 Encounter for initial prescription of implantable subdermal contraceptive: Secondary | ICD-10-CM | POA: Diagnosis not present

## 2021-03-23 DIAGNOSIS — Z3A38 38 weeks gestation of pregnancy: Secondary | ICD-10-CM

## 2021-03-23 DIAGNOSIS — Z3492 Encounter for supervision of normal pregnancy, unspecified, second trimester: Secondary | ICD-10-CM

## 2021-03-23 DIAGNOSIS — Z3482 Encounter for supervision of other normal pregnancy, second trimester: Principal | ICD-10-CM

## 2021-03-23 DIAGNOSIS — O365930 Maternal care for other known or suspected poor fetal growth, third trimester, not applicable or unspecified: Secondary | ICD-10-CM | POA: Diagnosis present

## 2021-03-23 DIAGNOSIS — O365990 Maternal care for other known or suspected poor fetal growth, unspecified trimester, not applicable or unspecified: Secondary | ICD-10-CM | POA: Diagnosis present

## 2021-03-23 DIAGNOSIS — Z975 Presence of (intrauterine) contraceptive device: Secondary | ICD-10-CM

## 2021-03-23 DIAGNOSIS — O2342 Unspecified infection of urinary tract in pregnancy, second trimester: Secondary | ICD-10-CM

## 2021-03-23 DIAGNOSIS — Z87898 Personal history of other specified conditions: Secondary | ICD-10-CM

## 2021-03-23 HISTORY — DX: Urinary tract infection, site not specified: N39.0

## 2021-03-23 LAB — CBC
HCT: 29.1 % — ABNORMAL LOW (ref 36.0–46.0)
Hemoglobin: 9.5 g/dL — ABNORMAL LOW (ref 12.0–15.0)
MCH: 26.7 pg (ref 26.0–34.0)
MCHC: 32.6 g/dL (ref 30.0–36.0)
MCV: 81.7 fL (ref 80.0–100.0)
Platelets: 253 10*3/uL (ref 150–400)
RBC: 3.56 MIL/uL — ABNORMAL LOW (ref 3.87–5.11)
RDW: 14.6 % (ref 11.5–15.5)
WBC: 7.9 10*3/uL (ref 4.0–10.5)
nRBC: 0 % (ref 0.0–0.2)

## 2021-03-23 LAB — TYPE AND SCREEN
ABO/RH(D): A POS
Antibody Screen: NEGATIVE

## 2021-03-23 LAB — RPR: RPR Ser Ql: NONREACTIVE

## 2021-03-23 MED ORDER — OXYTOCIN BOLUS FROM INFUSION
333.0000 mL | Freq: Once | INTRAVENOUS | Status: AC
  Administered 2021-03-23: 333 mL via INTRAVENOUS

## 2021-03-23 MED ORDER — FENTANYL CITRATE (PF) 100 MCG/2ML IJ SOLN: 100.0000 ug | INTRAMUSCULAR | Status: DC | PRN

## 2021-03-23 MED ORDER — LACTATED RINGERS IV SOLN: INTRAVENOUS | Status: DC

## 2021-03-23 MED ORDER — SENNOSIDES-DOCUSATE SODIUM 8.6-50 MG PO TABS
2.0000 | ORAL_TABLET | Freq: Every day | ORAL | Status: DC
  Administered 2021-03-24: 2 via ORAL
  Filled 2021-03-23: qty 2

## 2021-03-23 MED ORDER — LIDOCAINE HCL (PF) 1 % IJ SOLN
30.0000 mL | INTRAMUSCULAR | Status: DC | PRN
Start: 2021-03-23 — End: 2021-03-23

## 2021-03-23 MED ORDER — BENZOCAINE-MENTHOL 20-0.5 % EX AERO
1.0000 "application " | INHALATION_SPRAY | CUTANEOUS | Status: DC | PRN
  Filled 2021-03-23: qty 56

## 2021-03-23 MED ORDER — COCONUT OIL OIL: 1.0000 "application " | TOPICAL_OIL | Status: DC | PRN

## 2021-03-23 MED ORDER — ONDANSETRON HCL 4 MG/2ML IJ SOLN: 4.0000 mg | INTRAMUSCULAR | Status: DC | PRN

## 2021-03-23 MED ORDER — ACETAMINOPHEN 325 MG PO TABS: 650.0000 mg | ORAL_TABLET | ORAL | Status: DC | PRN

## 2021-03-23 MED ORDER — LACTATED RINGERS IV SOLN: 500.0000 mL | INTRAVENOUS | Status: DC | PRN

## 2021-03-23 MED ORDER — SOD CITRATE-CITRIC ACID 500-334 MG/5ML PO SOLN
30.0000 mL | ORAL | Status: DC | PRN
Start: 2021-03-23 — End: 2021-03-23

## 2021-03-23 MED ORDER — ONDANSETRON HCL 4 MG PO TABS: 4.0000 mg | ORAL_TABLET | ORAL | Status: DC | PRN

## 2021-03-23 MED ORDER — OXYTOCIN-SODIUM CHLORIDE 30-0.9 UT/500ML-% IV SOLN: 2.5000 [IU]/h | INTRAVENOUS | Status: DC

## 2021-03-23 MED ORDER — TETANUS-DIPHTH-ACELL PERTUSSIS 5-2.5-18.5 LF-MCG/0.5 IM SUSY: 0.5000 mL | PREFILLED_SYRINGE | Freq: Once | INTRAMUSCULAR | Status: DC

## 2021-03-23 MED ORDER — PHENYLEPHRINE 40 MCG/ML (10ML) SYRINGE FOR IV PUSH (FOR BLOOD PRESSURE SUPPORT): 80.0000 ug | PREFILLED_SYRINGE | INTRAVENOUS | Status: DC | PRN

## 2021-03-23 MED ORDER — DIPHENHYDRAMINE HCL 25 MG PO CAPS: 25.0000 mg | ORAL_CAPSULE | Freq: Four times a day (QID) | ORAL | Status: DC | PRN

## 2021-03-23 MED ORDER — FERROUS SULFATE 325 (65 FE) MG PO TABS
325.0000 mg | ORAL_TABLET | ORAL | Status: DC
  Administered 2021-03-24: 325 mg via ORAL
  Filled 2021-03-23: qty 1

## 2021-03-23 MED ORDER — SIMETHICONE 80 MG PO CHEW: 80.0000 mg | CHEWABLE_TABLET | ORAL | Status: DC | PRN

## 2021-03-23 MED ORDER — LACTATED RINGERS IV SOLN: 500.0000 mL | Freq: Once | INTRAVENOUS | Status: DC

## 2021-03-23 MED ORDER — ONDANSETRON HCL 4 MG/2ML IJ SOLN: 4.0000 mg | Freq: Four times a day (QID) | INTRAMUSCULAR | Status: DC | PRN

## 2021-03-23 MED ORDER — OXYCODONE-ACETAMINOPHEN 5-325 MG PO TABS: 1.0000 | ORAL_TABLET | ORAL | Status: DC | PRN

## 2021-03-23 MED ORDER — PRENATAL MULTIVITAMIN CH
1.0000 | ORAL_TABLET | Freq: Every day | ORAL | Status: DC
  Administered 2021-03-24: 1 via ORAL
  Filled 2021-03-23: qty 1

## 2021-03-23 MED ORDER — FENTANYL-BUPIVACAINE-NACL 0.5-0.125-0.9 MG/250ML-% EP SOLN
12.0000 mL/h | EPIDURAL | Status: DC | PRN
  Administered 2021-03-23: 12 mL/h via EPIDURAL
  Filled 2021-03-23: qty 250

## 2021-03-23 MED ORDER — LIDOCAINE HCL (PF) 1 % IJ SOLN
INTRAMUSCULAR | Status: DC | PRN
  Administered 2021-03-23: 8 mL via EPIDURAL
  Administered 2021-03-23: 2 mL via EPIDURAL

## 2021-03-23 MED ORDER — DIBUCAINE (PERIANAL) 1 % EX OINT: 1.0000 "application " | TOPICAL_OINTMENT | CUTANEOUS | Status: DC | PRN

## 2021-03-23 MED ORDER — OXYTOCIN-SODIUM CHLORIDE 30-0.9 UT/500ML-% IV SOLN
1.0000 m[IU]/min | INTRAVENOUS | Status: DC
  Administered 2021-03-23: 2 m[IU]/min via INTRAVENOUS
  Filled 2021-03-23: qty 500

## 2021-03-23 MED ORDER — OXYCODONE-ACETAMINOPHEN 5-325 MG PO TABS: 2.0000 | ORAL_TABLET | ORAL | Status: DC | PRN

## 2021-03-23 MED ORDER — TERBUTALINE SULFATE 1 MG/ML IJ SOLN: 0.2500 mg | Freq: Once | INTRAMUSCULAR | Status: DC | PRN

## 2021-03-23 MED ORDER — EPHEDRINE 5 MG/ML INJ: 10.0000 mg | INTRAVENOUS | Status: DC | PRN

## 2021-03-23 MED ORDER — IBUPROFEN 600 MG PO TABS
600.0000 mg | ORAL_TABLET | Freq: Four times a day (QID) | ORAL | Status: DC
  Administered 2021-03-23 – 2021-03-24 (×4): 600 mg via ORAL
  Filled 2021-03-23 (×4): qty 1

## 2021-03-23 MED ORDER — DIPHENHYDRAMINE HCL 50 MG/ML IJ SOLN: 12.5000 mg | INTRAMUSCULAR | Status: DC | PRN

## 2021-03-23 MED ORDER — WITCH HAZEL-GLYCERIN EX PADS: 1.0000 "application " | MEDICATED_PAD | CUTANEOUS | Status: DC | PRN

## 2021-03-23 NOTE — Anesthesia Procedure Notes (Signed)
Epidural ?Patient location during procedure: OB ?Start time: 03/23/2021 11:32 AM ?End time: 03/23/2021 11:43 AM ? ?Staffing ?Anesthesiologist: Lucretia Kern, MD ?Performed: anesthesiologist  ? ?Preanesthetic Checklist ?Completed: patient identified, IV checked, risks and benefits discussed, monitors and equipment checked, pre-op evaluation and timeout performed ? ?Epidural ?Patient position: sitting ?Prep: DuraPrep ?Patient monitoring: heart rate, continuous pulse ox and blood pressure ?Approach: midline ?Location: L3-L4 ?Injection technique: LOR air ? ?Needle:  ?Needle type: Tuohy  ?Needle gauge: 17 G ?Needle length: 9 cm ?Needle insertion depth: 7 cm ?Catheter type: closed end flexible ?Catheter size: 19 Gauge ?Catheter at skin depth: 12 cm ?Test dose: negative ? ?Assessment ?Events: blood not aspirated, injection not painful, no injection resistance, no paresthesia and negative IV test ? ?Additional Notes ?Reason for block:procedure for pain ? ? ? ?

## 2021-03-23 NOTE — Anesthesia Preprocedure Evaluation (Signed)
Anesthesia Evaluation  ?Patient identified by MRN, date of birth, ID band ?Patient awake ? ? ? ?Reviewed: ?Allergy & Precautions, H&P , NPO status , Patient's Chart, lab work & pertinent test results ? ?History of Anesthesia Complications ?Negative for: history of anesthetic complications ? ?Airway ?Mallampati: II ? ?TM Distance: >3 FB ? ? ? ? Dental ?  ?Pulmonary ?neg pulmonary ROS,  ?  ?Pulmonary exam normal ? ? ? ? ? ? ? Cardiovascular ?negative cardio ROS ? ? ?Rhythm:regular Rate:Normal ? ? ?  ?Neuro/Psych ?Seizures -,  negative psych ROS  ? GI/Hepatic ?negative GI ROS, Neg liver ROS,   ?Endo/Other  ?Morbid obesity ? Renal/GU ?negative Renal ROS  ?negative genitourinary ?  ?Musculoskeletal ? ? Abdominal ?  ?Peds ? Hematology ? ?(+) Blood dyscrasia, anemia ,   ?Anesthesia Other Findings ? ? Reproductive/Obstetrics ?(+) Pregnancy ? ?  ? ? ? ? ? ? ? ? ? ? ? ? ? ?  ?  ? ? ? ? ? ? ? ? ?Anesthesia Physical ?Anesthesia Plan ? ?ASA: 2 ? ?Anesthesia Plan: Epidural  ? ?Post-op Pain Management:   ? ?Induction:  ? ?PONV Risk Score and Plan:  ? ?Airway Management Planned:  ? ?Additional Equipment:  ? ?Intra-op Plan:  ? ?Post-operative Plan:  ? ?Informed Consent: I have reviewed the patients History and Physical, chart, labs and discussed the procedure including the risks, benefits and alternatives for the proposed anesthesia with the patient or authorized representative who has indicated his/her understanding and acceptance.  ? ? ? ? ? ?Plan Discussed with:  ? ?Anesthesia Plan Comments:   ? ? ? ? ? ? ?Anesthesia Quick Evaluation ? ?

## 2021-03-23 NOTE — H&P (Addendum)
OBSTETRIC ADMISSION HISTORY AND PHYSICAL ? ?Kristi Novak is a 26 y.o. female G3P2002 at [redacted]w[redacted]d by 6 week ultrasound presenting for IOL for FGR (EFW 15% normal dopplers). She reports +FMs, no LOF, no VB, no blurry vision, headaches, peripheral edema, or RUQ pain.  She plans on breast feeding. She requests Nexplanon for birth control postpartum. ? ?She received her prenatal [facility] at [facility]  ? ?Dating: By 8MV6H --->  Estimated Date of Delivery: 04/06/21 ? ?Sono:   ?@[redacted]w[redacted]d , normal anatomy, breech presentation, posterior placental lie, 2365g, 15% EFW ? ?Prenatal History/Complications:  ?FGR (EFW 15%, AC previously < 10%, most recently 16% on 2/28, normal doppler studies)  ?History of seizures (last episode 2007) ? ?Past Medical History: ?Past Medical History:  ?Diagnosis Date  ? Chronic UTI (urinary tract infection)   ? H/O chlamydia infection   ? H/O trichomoniasis   ? Hearing loss   ? History of idiopathic seizure X2  2007--- NONE SINCE   ? Hypertropia RIGHT EYE  ? Seizures (HCC) 2007  ? Idiopathic   had two seizures (none since)  ? ? ?Past Surgical History: ?Past Surgical History:  ?Procedure Laterality Date  ? EYE SURGERY  05-01-2005  [facility]  ? LEFT EYE  AND  1999 BILATERAL EYES 1999  ? EYE SURGERY    ? MEDIAN RECTUS REPAIR  08/21/2011  ? Procedure: MEDIAN RECTUS REPAIR;  Surgeon: 08/23/2011, MD;  Location: [facility];  Service: Ophthalmology;  Laterality: Right;  inferior oblique myectomy  ? TYMPANOSTOMY TUBE PLACEMENT  AGE 53  ? AND ADENOIDECTOMY  ? WISDOM TOOTH EXTRACTION    ? ORAL SURGEON OFFICE  ? ? ?Obstetrical History: ?OB History   ? ? Gravida  ?3  ? Para  ?2  ? Term  ?2  ? Preterm  ?0  ? AB  ?0  ? Living  ?2  ?  ? ? SAB  ?0  ? IAB  ?0  ? Ectopic  ?0  ? Multiple  ?0  ? Live Births  ?2  ?   ?  ?  ? ? ?Social History ?Social History  ? ?Socioeconomic History  ? Marital status: Single  ?  Spouse name: Not on file  ? Number of children: 1  ? Years of education: Not on file  ? Highest  education level: Not on file  ?Occupational History  ? Occupation: student  ?Tobacco Use  ? Smoking status: Never  ? Smokeless tobacco: Never  ?Vaping Use  ? Vaping Use: Never used  ?Substance and Sexual Activity  ? Alcohol use: No  ? Drug use: No  ? Sexual activity: Not Currently  ?  Partners: Male  ?  Birth control/protection: None  ?Other Topics Concern  ? Not on file  ?Social History Narrative  ? Lives with mother, grandmother and child.    ? 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   ? ?Social Determinants of Health  ? ?Financial Resource Strain: Not on file  ?Food Insecurity: Not on file  ?Transportation Needs: Not on file  ?Physical Activity: Not on file  ?Stress: Not on file  ?Social Connections: Not on file  ? ? ?Family History: ?Family History  ?Problem Relation Age of Onset  ? Cancer Maternal Grandmother   ?     breast  ? Hypertension Other   ? Healthy Mother   ? Healthy Father   ? ? ?Allergies: ?No Known Allergies ? ?Medications Prior to Admission  ?Medication  Sig Dispense Refill Last Dose  ? ferrous sulfate (FERROUSUL) 325 (65 FE) MG tablet Take 1 tablet (325 mg total) by mouth every other day. 30 tablet 3   ? Misc. Devices (GOJJI WEIGHT SCALE) MISC 1 Device by Does not apply route every 30 (thirty) days. (Patient not taking: Reported on 10/18/2020) 1 each 0   ? Prenatal Vit-Fe Phos-FA-Omega (VITAFOL GUMMIES) 3.33-0.333-34.8 MG CHEW Chew 3 tablets by mouth daily. (Patient not taking: Reported on 03/20/2021) 90 tablet 11   ? ? ? ?Review of Systems  ?All systems reviewed and negative except as stated in HPI ? ?Blood pressure 127/65, pulse 74, temperature 98.4 ?F (36.9 ?C), temperature source Oral, resp. rate 18, height 5\' 6"  (1.676 m), weight 117.3 kg, last menstrual period 05/28/2020. ? ?General appearance: alert, cooperative, and appears stated age ?Lungs: normal work of breathing on room air ?Heart: normal rate, good peripheral perfusion ?Abdomen: soft, non-tender;  gravid ?Extremities: no lower extremity edema, no calf tenderness ? ?Presentation: Cephalic ?Fetal monitoring: Baseline: 140 bpm, Variability: Good {> 6 bpm), Accelerations: Reactive, and Decelerations: Absent ?Uterine activity: Frequency: Every 5 minutes ?Dilation: 3 ?Effacement (%): 70 ?Station: -2 ?Exam by:: Johnathan HausenAshley Gray RN ? ? ?Prenatal labs: ?ABO, Rh: --/--/A POS (03/17 21300707) ?Antibody: NEG (03/17 0707) ?Rubella: 3.52 (09/14 1111) ?RPR: Non Reactive (01/17 0840)  ?HBsAg: Negative (09/14 1111)  ?HIV: Non Reactive (01/17 0840)  ?GBS: Negative/-- (03/07 1404)  ?2 hr Glucola normal ?Genetic screening - NIPS low risk, AFP normal ?Anatomy US normal ? ?Prenatal Transfer Tool  ?Maternal Diabetes: No ?Genetic Screening: Normal ?Maternal Ultrasounds/Referrals: IUGR ?Fetal Ultrasounds or other Referrals:  Other:  Growth f/u scans stable, BPP and Cord dopplers normal ?Maternal Substance Abuse:  No ?Significant Maternal Medications:  Meds include: Other: PNV, ferrous sulfate ?Significant Maternal Lab Results: Group B Strep negative ? ?Results for orders placed or performed during the hospital encounter of 03/23/21 (from the past 24 hour(s))  ?CBC  ? Collection Time: 03/23/21  7:07 AM  ?Result Value Ref Range  ? WBC 7.9 4.0 - 10.5 K/uL  ? RBC 3.56 (L) 3.87 - 5.11 MIL/uL  ? Hemoglobin 9.5 (L) 12.0 - 15.0 g/dL  ? HCT 29.1 (L) 36.0 - 46.0 %  ? MCV 81.7 80.0 - 100.0 fL  ? MCH 26.7 26.0 - 34.0 pg  ? MCHC 32.6 30.0 - 36.0 g/dL  ? RDW 14.6 11.5 - 15.5 %  ? Platelets 253 150 - 400 K/uL  ? nRBC 0.0 0.0 - 0.2 %  ?Type and screen  ? Collection Time: 03/23/21  7:07 AM  ?Result Value Ref Range  ? ABO/RH(D) PENDING   ? Antibody Screen PENDING   ? Sample Expiration    ?  03/26/2021,2359 ?Performed at [facility], [address] [place], [place] [id number] ?  ? ? ?Patient Active Problem List  ? Diagnosis Date Noted  ? Pregnancy affected by fetal growth restriction 03/23/2021  ? Fetal growth restriction antepartum 02/13/2021  ? Urinary  tract infection in pregnancy in second trimester, antepartum 11/26/2020  ? Nausea/vomiting in pregnancy 09/20/2020  ? History of seizures 09/20/2020  ? Encounter for supervision of normal pregnancy in second trimester 08/16/2020  ? Hypertropia 08/20/2011  ? ? ?Assessment/Plan:  ?Kristi Novak is a 26 y.o. G3P2002 at 5758w0d here for IOL for FGR (EFW 15%, normal dopplers). ? ?#Labor: Favorable cervix, initial exam 3 / 70 / -2. Pitocin started at 0751. Plan for recheck and potential AROM in 3-4 hours. Patient would  like epidural before AROM. ?#Pain: Epidural before AROM ?#FWB: Category 1 ?#ID:  GBS negative ?#MOF: Breast ?#MOC: Nexplanon inpatient ? ?#FGR: EFW 15% with normal dopplers, AC previously 9%, however, most recently 16% on most recent US which is reassuring. Will continue to monitor FHT closely in labor.  ? ?#History of Seizures: No hx of seizure in 10+ years, she is not followed by Neurology or taking medication at this time.  ? ?Theresia Majors, MD  ?03/23/2021, 10:10 AM ? ?GME ATTESTATION:  ?I saw and evaluated the patient. I agree with the findings and the plan of care as documented in the resident?s note. I have made changes to documentation as necessary. ? ?IOL for FGR, now seemingly resolved on most recent US. Cat 1 tracing. Favorable SVE upon admission. Pitocin 2x2 started. Plan for AROM on next check.  ? ?Evalina Field, MD ?OB Fellow, Faculty Practice ?[place] ?03/23/2021 12:21 PM ? ? ? ? ?

## 2021-03-23 NOTE — Progress Notes (Signed)
Labor Progress Note ?GERIAH BATAILLE is a 26 y.o. G3P2002 at [redacted]w[redacted]d who presented for IOL due to FGR, now resolved.  ? ?S: Doing well. Comfortable with epidural. No concerns at this time.  ? ?O:  ?BP 109/67   Pulse 78   Temp 98 ?F (36.7 ?C) (Oral)   Resp 18   Ht 5\' 6"  (1.676 m)   Wt 117.3 kg   LMP 05/28/2020   SpO2 96%   BMI 41.76 kg/m?  ? ?EFM: Baseline 135 bpm, moderate variability, + accels, no decels ?Toco: Every 2-3 min  ? ?CVE: Dilation: 4.5 ?Effacement (%): 50 ?Station: -3 ?Presentation: Vertex ?Exam by:: Dr. Gwenlyn Perking ? ?A&P: 26 y.o. [id number] [redacted]w[redacted]d  ? ?#Labor: Progressing well. AROM discussed and patient verbally consented. AROM performed with clear/bloody fluid. Mom and baby tolerated this well. Will continue Pitocin and reassess in 4 hours, sooner as needed.  ?#Pain: Epidural  ?#FWB: Cat 1  ?#GBS negative ? ?Genia Del, MD ?1:10 PM ? ?

## 2021-03-23 NOTE — Discharge Summary (Signed)
? ?  Postpartum Discharge Summary ? ?   ?Patient Name: Kristi Novak ?DOB: 04-12-1995 ?MRN: [id number] ? ?Date of admission: 03/23/2021 ?Delivery date:03/23/2021  ?Delivering provider: Genia Del  ?Date of discharge: 03/24/2021 ? ?Admitting diagnosis: Pregnancy affected by fetal growth restriction [O36.5990] ?Intrauterine pregnancy: [redacted]w[redacted]d    ?Secondary diagnosis:  Principal Problem: ?  Vaginal delivery ?Active Problems: ?  Encounter for supervision of normal pregnancy in second trimester ?  History of seizures ?  Pregnancy affected by fetal growth restriction ?  Nexplanon in place ? ?Additional problems: None    ?Discharge diagnosis: Term Pregnancy Delivered                                              ?Post partum procedures: Nexplanon placement  ?Augmentation: AROM and Pitocin ?Complications: None ? ?Hospital course: Induction of Labor With Vaginal Delivery   ?26y.o. yo G3P2002 at 380w0das admitted to the hospital 03/23/2021 for induction of labor.  Indication for induction:  FGR .  Patient had an uncomplicated labor course and progressed to complete after Pitocin and AROM. She had an uncomplicated vaginal delivery.  ?Membrane Rupture Time/Date: 1:06 PM ,03/23/2021   ?Delivery Method:Vaginal, Spontaneous  ?Episiotomy: None  ?Lacerations:  Periurethral  ?Details of delivery can be found in separate delivery note.  Patient had a routine postpartum course.  She is eating, drinking, ambulating, and voiding without issue. Patient is discharged home 03/24/21. ? ?Newborn Data: ?Birth date:03/23/2021  ?Birth time:4:36 PM  ?Gender:Female  ?Living status:Living  ?Apgars:8 ,9  ?Weight:2905 g  ? ?Magnesium Sulfate received: No ?BMZ received: No ?Rhophylac: N/A ?MMR: N/A ?T-DaP: Declined  ?Flu: Declined  ?Transfusion: No ? ?Physical exam  ?Vitals:  ? 03/24/21 0024 03/24/21 0331 03/24/21 0754 03/24/21 1622  ?BP: 109/68 120/74 115/70 117/73  ?Pulse: 78 85 65 68  ?Resp: 18 18 17 18   ?Temp: 98.3 ?F (36.8 ?C) 98.7 ?F (37.1  ?C) 98.1 ?F (36.7 ?C) 98.4 ?F (36.9 ?C)  ?TempSrc: Oral Oral Oral Oral  ?SpO2:   100%   ?Weight:      ?Height:      ? ?General: alert, cooperative, and no distress ?Lochia: appropriate ?Uterine Fundus: firm and below umbilicus  ?DVT Evaluation: no LE edema or calf tenderness to palpation ? ?Labs: ?Lab Results  ?Component Value Date  ? WBC 7.9 03/23/2021  ? HGB 9.5 (L) 03/23/2021  ? HCT 29.1 (L) 03/23/2021  ? MCV 81.7 03/23/2021  ? PLT 253 03/23/2021  ? ?CMP Latest Ref Rng & Units 01/17/2009  ?Glucose 70 - 99 mg/dL 90  ?BUN 6 - 23 mg/dL 14  ?Creatinine 0.4 - 1.2 mg/dL 0.72  ?Sodium 135 - 145 mEq/L 139  ?Potassium 3.5 - 5.1 mEq/L 4.6  ?Chloride 96 - 112 mEq/L 106  ?CO2 19 - 32 mEq/L 23  ?Calcium 8.4 - 10.5 mg/dL 9.5  ? ?Edinburgh Score: ?Edinburgh Postnatal Depression Scale Screening Tool 03/24/2021  ?I have been able to laugh and see the funny side of things. 0  ?I have looked forward with enjoyment to things. 0  ?I have blamed myself unnecessarily when things went wrong. 0  ?I have been anxious or worried for no good reason. 0  ?I have felt scared or panicky for no good reason. 0  ?Things have been getting on top of me. 0  ?I have been so  unhappy that I have had difficulty sleeping. 0  ?I have felt sad or miserable. 0  ?I have been so unhappy that I have been crying. 0  ?The thought of harming myself has occurred to me. 0  ?Edinburgh Postnatal Depression Scale Total 0  ? ? ? ?After visit meds:  ?Allergies as of 03/24/2021   ?No Known Allergies ?  ? ?  ?Medication List  ?  ? ?STOP taking these medications   ? ?Gojji Weight Scale Misc ?  ? ?  ? ?TAKE these medications   ? ?acetaminophen 500 MG tablet ?Commonly known as: TYLENOL ?Take 2 tablets (1,000 mg total) by mouth every 8 (eight) hours as needed (pain). ?  ?ferrous sulfate 325 (65 FE) MG tablet ?Commonly known as: FerrouSul ?Take 1 tablet (325 mg total) by mouth every other day. ?  ?ibuprofen 600 MG tablet ?Commonly known as: ADVIL ?Take 1 tablet (600 mg total) by  mouth every 6 (six) hours as needed (pain). ?  ?Vitafol Gummies 3.33-0.333-34.8 MG Chew ?Chew 3 tablets by mouth daily. ?  ? ?  ? ? ? ?Discharge home in stable condition ?Infant Feeding: Breast ?Infant Disposition: home with mother ?Discharge instruction: per After Visit Summary and Postpartum booklet. ?Activity: Advance as tolerated. Pelvic rest for 6 weeks.  ?Diet: routine diet ?Future Appointments: ?Future Appointments  ?Date Time Provider [place]  ?03/27/2021 11:15 AM Griffin Basil, MD [facility] None  ?03/27/2021  1:45 PM [facility] NURSE [facility]  ?03/27/2021  2:00 PM [facility] US1 [facility]  ?04/03/2021  1:50 PM Woodroe Mode, MD [facility] None  ? ?Follow up Visit: ?Message sent to [facility] by Dr. Gwenlyn Perking on 03/23/21.  ? ?Please schedule this patient for a In person postpartum visit in 6 weeks with the following provider: Any provider. ?Additional Postpartum F/U: None   ?High risk pregnancy complicated by:  FGR ?Delivery mode:  Vaginal, Spontaneous  ?Anticipated Birth Control:  PP Nexplanon placed ? ?03/24/2021 ?Genia Del, MD ? ? ? ?

## 2021-03-23 NOTE — Lactation Note (Signed)
This note was copied from a baby's chart. ?Lactation Consultation Note ? ?Patient Name: Kristi Novak ?Today's Date: 03/23/2021 ?Reason for consult: L&D Initial assessment;1st time breastfeeding;Early term 37-38.6wks ?Age:26 hours ? ? ?Initial L&D Consult: ? ?Visited with family > 1 hour after birth ?RN in room completing measurements and weight.  Assisted baby to latch easily, however, she took a few sucks on/off only.  Multiple attempts made.  Mother would benefit from having a manual pump for nipple eversion on the M/B unit.  Reassured family that lactation services will be available tonight as needed.  Family member present.  Allowed time for bonding; baby STS and asleep. ? ? ?Maternal Data ?  ? ?Feeding ?Mother's Current Feeding Choice: Breast Milk ? ?LATCH Score ?Latch: Repeated attempts needed to sustain latch, nipple held in mouth throughout feeding, stimulation needed to elicit sucking reflex. ? ?Audible Swallowing: None ? ?Type of Nipple: Everted at rest and after stimulation (Short shafted) ? ?Comfort (Breast/Nipple): Soft / non-tender ? ?Hold (Positioning): Assistance needed to correctly position infant at breast and maintain latch. ? ?LATCH Score: 6 ? ? ?Lactation Tools Discussed/Used ?  ? ?Interventions ?  ? ?Discharge ?  ? ?Consult Status ?Consult Status: Follow-up from L&D ? ? ? ?Kristi Novak ?03/23/2021, 6:09 PM ? ? ? ?

## 2021-03-24 DIAGNOSIS — Z30017 Encounter for initial prescription of implantable subdermal contraceptive: Secondary | ICD-10-CM

## 2021-03-24 DIAGNOSIS — Z975 Presence of (intrauterine) contraceptive device: Secondary | ICD-10-CM

## 2021-03-24 MED ORDER — ACETAMINOPHEN 500 MG PO TABS: 1000.0000 mg | ORAL_TABLET | Freq: Three times a day (TID) | ORAL | 0 refills | Status: DC | PRN

## 2021-03-24 MED ORDER — ETONOGESTREL 68 MG ~~LOC~~ IMPL
68.0000 mg | DRUG_IMPLANT | Freq: Once | SUBCUTANEOUS | Status: AC
  Administered 2021-03-24: 68 mg via SUBCUTANEOUS
  Filled 2021-03-24: qty 1

## 2021-03-24 MED ORDER — IBUPROFEN 600 MG PO TABS: 600.0000 mg | ORAL_TABLET | Freq: Four times a day (QID) | ORAL | 0 refills | Status: AC | PRN

## 2021-03-24 MED ORDER — LIDOCAINE HCL 1 % IJ SOLN
0.0000 mL | Freq: Once | INTRAMUSCULAR | Status: AC | PRN
  Administered 2021-03-24: 2 mL via INTRADERMAL
  Filled 2021-03-24: qty 20

## 2021-03-24 NOTE — Procedures (Signed)
PROCEDURE NOTE ? ?Kristi Novak is a 26 y.o. [phone] who desired Nexplanon insertion. ? ?Nexplanon Insertion Procedure ?Patient identified, informed consent performed, consent signed.   Patient does understand that irregular bleeding is a very common side effect of this medication. She was advised to have backup contraception for one week after placement. Appropriate time out taken.   ? ?Patient's left arm was prepped and draped in the usual sterile fashion. The ruler used to measure and mark insertion area.  Patient was prepped with alcohol swab and then injected with 3 ml of 1% lidocaine.  She was prepped with betadine, Nexplanon removed from packaging,  Device confirmed in needle, then inserted full length of needle and withdrawn per handbook instructions. Nexplanon was able to palpated in the patient's arm; patient palpated the insert herself. There was minimal blood loss.  Patient insertion site covered with guaze and a pressure bandage to reduce any bruising.   ? ?The patient tolerated the procedure well and was given post procedure instructions.  ? ?Milas Hock, MD, FACOG ?Obstetrician Heritage manager, Faculty Practice ?Center for [employer], [facility] ? ? ? ? ?

## 2021-03-24 NOTE — Plan of Care (Signed)
?  Problem: Education: ?Goal: Knowledge of General Education information will improve ?Description: Including pain rating scale, medication(s)/side effects and non-pharmacologic comfort measures ?Outcome: Completed/Met ?  ?Problem: Health Behavior/Discharge Planning: ?Goal: Ability to manage health-related needs will improve ?Outcome: Completed/Met ?  ?Problem: Clinical Measurements: ?Goal: Ability to maintain clinical measurements within normal limits will improve ?Outcome: Completed/Met ?Goal: Will remain free from infection ?Outcome: Completed/Met ?Goal: Diagnostic test results will improve ?Outcome: Completed/Met ?Goal: Respiratory complications will improve ?Outcome: Completed/Met ?Goal: Cardiovascular complication will be avoided ?Outcome: Completed/Met ?  ?Problem: Activity: ?Goal: Risk for activity intolerance will decrease ?Outcome: Completed/Met ?  ?Problem: Coping: ?Goal: Level of anxiety will decrease ?Outcome: Completed/Met ?  ?Problem: Elimination: ?Goal: Will not experience complications related to urinary retention ?Outcome: Completed/Met ?  ?Problem: Pain Managment: ?Goal: General experience of comfort will improve ?Outcome: Completed/Met ?  ?Problem: Safety: ?Goal: Ability to remain free from injury will improve ?Outcome: Completed/Met ?  ?Problem: Skin Integrity: ?Goal: Risk for impaired skin integrity will decrease ?Outcome: Completed/Met ?  ?Problem: Education: ?Goal: Knowledge of condition will improve ?Outcome: Completed/Met ?Goal: Individualized Educational Video(s) ?Outcome: Completed/Met ?  ?Problem: Activity: ?Goal: Will verbalize the importance of balancing activity with adequate rest periods ?Outcome: Completed/Met ?Goal: Ability to tolerate increased activity will improve ?Outcome: Completed/Met ?  ?Problem: Life Cycle: ?Goal: Chance of risk for complications during the postpartum period will decrease ?Outcome: Completed/Met ?  ?Problem: Role Relationship: ?Goal: Ability to demonstrate  positive interaction with newborn will improve ?Outcome: Completed/Met ?  ?Problem: Education: ?Goal: Knowledge of condition will improve ?Outcome: Completed/Met ?  ?Problem: Life Cycle: ?Goal: Chance of risk for complications during the postpartum period will decrease ?Outcome: Completed/Met ?  ?Problem: Role Relationship: ?Goal: Ability to demonstrate positive interaction with newborn will improve ?Outcome: Completed/Met ?  ?Problem: Skin Integrity: ?Goal: Demonstration of wound healing without infection will improve ?Outcome: Completed/Met ?  ?

## 2021-03-24 NOTE — Plan of Care (Signed)
?  Problem: Education: Goal: Knowledge of General Education information will improve Description: Including pain rating scale, medication(s)/side effects and non-pharmacologic comfort measures Outcome: Completed/Met   Problem: Education: Goal: Knowledge of condition will improve Outcome: Completed/Met   

## 2021-03-24 NOTE — Plan of Care (Signed)
?  Problem: Education: ?Goal: Knowledge of General Education information will improve ?Description: Including pain rating scale, medication(s)/side effects and non-pharmacologic comfort measures ?Outcome: Completed/Met ?  ?Problem: Health Behavior/Discharge Planning: ?Goal: Ability to manage health-related needs will improve ?Outcome: Completed/Met ?  ?Problem: Clinical Measurements: ?Goal: Ability to maintain clinical measurements within normal limits will improve ?Outcome: Completed/Met ?Goal: Will remain free from infection ?Outcome: Completed/Met ?Goal: Diagnostic test results will improve ?Outcome: Completed/Met ?Goal: Respiratory complications will improve ?Outcome: Completed/Met ?Goal: Cardiovascular complication will be avoided ?Outcome: Completed/Met ?  ?Problem: Activity: ?Goal: Risk for activity intolerance will decrease ?Outcome: Completed/Met ?  ?Problem: Coping: ?Goal: Level of anxiety will decrease ?Outcome: Completed/Met ?  ?Problem: Pain Managment: ?Goal: General experience of comfort will improve ?Outcome: Completed/Met ?  ?Problem: Safety: ?Goal: Ability to remain free from injury will improve ?Outcome: Completed/Met ?  ?Problem: Skin Integrity: ?Goal: Risk for impaired skin integrity will decrease ?Outcome: Completed/Met ?  ?Problem: Education: ?Goal: Knowledge of condition will improve ?Outcome: Completed/Met ?Goal: Individualized Educational Video(s) ?Outcome: Completed/Met ?  ?

## 2021-03-24 NOTE — Lactation Note (Signed)
This note was copied from a baby's chart. ?Lactation Consultation Note ? ?Patient Name: [name] ?Today's Date: 03/24/2021 ?Reason for consult: Initial assessment;Early term 37-38.6wks ?Age:26 hours ? ?Lactation Tools Discussed/Used ?Tools: Pump;Flanges ?Flange Size: 21 ?Breast pump type: Manual ?Pumping frequency: mom encouraged to pump whenever she gives formula ? ?Mom says it hurts to use hand pump. Mom was provided with a size 21 flange; Mom encouraged to pump when she gives formula.  ? ?Mom was made aware of O/P services, breastfeeding support groups, community resources ([employer]), and our phone # for post-discharge questions.  ? ?[name] ?03/24/2021, 8:40 AM ? ? ? ?

## 2021-03-24 NOTE — Progress Notes (Signed)
Post Partum Day 1 ?Subjective: ?no complaints, up ad lib, voiding, tolerating PO, and + flatus ? ?Objective: ?Blood pressure 115/70, pulse 65, temperature 98.1 ?F (36.7 ?C), temperature source Oral, resp. rate 17, height 5\' 6"  (1.676 m), weight 117.3 kg, last menstrual period 05/28/2020, SpO2 100 %, unknown if currently breastfeeding. ? ?Physical Exam:  ?General: alert, cooperative, and no distress ?Lochia: appropriate ?Uterine Fundus: firm ?Incision: n/a ?DVT Evaluation: No significant calf/ankle edema. ? ?Recent Labs  ?  03/23/21 ?0707  ?HGB 9.5*  ?HCT 29.1*  ? ? ?Assessment/Plan: ?Postpartum ?- Contraception: ?- MOF: breast ?- Rh status: positive ?- Rubella status: Immune ?- Birth control: Nexplanon placed at bedside. See procedure note for details.  ?- Dispo: Possible d/c this afternoon if desired by patient and baby d/c'd by peds ?- Consults: None at this time ? ?Neonatal ?- Doing well, at bedside ? ? ? LOS: 1 day  ? ?Radene Gunning ?03/24/2021, 11:51 AM  ? ? ?

## 2021-03-24 NOTE — Anesthesia Postprocedure Evaluation (Signed)
Anesthesia Post Note ? ?Patient: Kristi Novak ? ?Procedure(s) Performed: AN AD HOC LABOR EPIDURAL ? ?  ? ?Patient location during evaluation: Mother Baby ?Anesthesia Type: Epidural ?Level of consciousness: awake and alert ?Pain management: pain level controlled ?Vital Signs Assessment: post-procedure vital signs reviewed and stable ?Respiratory status: spontaneous breathing, nonlabored ventilation and respiratory function stable ?Cardiovascular status: stable ?Postop Assessment: no headache, no backache and epidural receding ?Anesthetic complications: no ? ? ?No notable events documented. ? ?Last Vitals:  ?Vitals:  ? 03/24/21 0331 03/24/21 0754  ?BP: 120/74 115/70  ?Pulse: 85 65  ?Resp: 18 17  ?Temp: 37.1 ?C 36.7 ?C  ?SpO2:  100%  ?  ?Last Pain:  ?Vitals:  ? 03/24/21 0754  ?TempSrc: Oral  ?PainSc: 0-No pain  ? ?Pain Goal:   ? ?  ?  ?  ?  ?  ?  ?  ? ?Fontella Shan ? ? ? ? ?

## 2021-03-24 NOTE — Lactation Note (Signed)
This note was copied from a baby's chart. ?Lactation Consultation Note ? ?Patient Name: Kristi Novak ?Today's Date: 03/24/2021 ?Reason for consult: Early term 27-38.6wks;Follow-up assessment;1st time breastfeeding;Infant weight loss (-2%, per mom she did not breastfeed her other children) ?Age:26 hours, early term female infant, -2% weight loss. ?Mom's current feeding choice is breastfeeding and formula feeding. ?Mom reports breastfeeding has been going well and baby stays latched at the breast for 20-30 minutes.  ?Mom is supplementing with formula after latching infant at breast.  ?Mom has hand pump, was not on [facility] during this pregnancy, but will call WIC on Monday morning. She will contact her insurance to see if she is eligible for a DEBP. ?Unable to provide [employer] Pump due to office being closed at 5PM. ?Reviewed on discharge community outpatient resources, normal infant input/output, cluster-feeding, newborn warning signs, and engorgement.  ?Per mom, pediatrician appointment is on Tuesday with [facility]. ?Mom knows to breastfeed infant on cue, 8-12x in a 24 hour period, and to supplement with EBM from hand pump first, followed by formula.  ? ?Maternal Data ?  ? ?Feeding ?Mother's Current Feeding Choice: Breast Milk and Formula ? ? ?Lactation Tools Discussed/Used ?  ? ?Interventions ?Interventions: Breast feeding basics reviewed;LC Services brochure;Education ? ?Discharge ?Discharge Education: Engorgement and breast care;Warning signs for feeding baby ?Pump: Advised to call insurance company;Manual ?WIC Program: No ? ?Consult Status ?Consult Status: Complete ?Follow-up type: Physician ? ? ? ?Antionette Char ?03/24/2021, 7:58 PM ? ? ? ?

## 2021-03-27 ENCOUNTER — Ambulatory Visit: Payer: Medicaid Other

## 2021-03-27 ENCOUNTER — Encounter: Payer: Medicaid Other | Admitting: Obstetrics and Gynecology

## 2021-04-02 ENCOUNTER — Telehealth (HOSPITAL_COMMUNITY): Payer: Self-pay | Admitting: *Deleted

## 2021-04-02 NOTE — Telephone Encounter (Signed)
Attempted hospital discharge follow-up call. Left message for patient to return RN call. Deforest Hoyles, RN, 04/02/21, [phone] ?

## 2021-04-03 ENCOUNTER — Encounter: Payer: Medicaid Other | Admitting: Obstetrics & Gynecology

## 2021-04-24 ENCOUNTER — Ambulatory Visit: Payer: Medicaid Other

## 2021-05-18 ENCOUNTER — Ambulatory Visit: Payer: Medicaid Other | Admitting: Obstetrics and Gynecology

## 2021-06-13 ENCOUNTER — Encounter: Payer: Self-pay | Admitting: Obstetrics & Gynecology

## 2021-06-13 ENCOUNTER — Ambulatory Visit (INDEPENDENT_AMBULATORY_CARE_PROVIDER_SITE_OTHER): Payer: Medicaid Other | Admitting: Obstetrics & Gynecology

## 2021-06-13 DIAGNOSIS — Z975 Presence of (intrauterine) contraceptive device: Secondary | ICD-10-CM

## 2021-06-13 NOTE — Progress Notes (Signed)
Post Partum Visit Note  Kristi Novak is a 26 y.o. [id number] female who presents for a postpartum visit. She is 9 weeks postpartum following a normal spontaneous vaginal delivery.  I have fully reviewed the prenatal and intrapartum course. The delivery was at 38.0 gestational weeks.  Anesthesia: epidural. Postpartum course has been UNREMARKABLE. Baby is doing well. Baby is feeding by bottle - Similac Advance. Bleeding staining only. Bowel function is normal. Bladder function is normal. Patient is not sexually active. Contraception method is Nexplanon. Postpartum depression screening: negative.   Upstream - 06/13/21 1510       Pregnancy Intention Screening   Does the patient want to become pregnant in the next year? No    Does the patient's partner want to become pregnant in the next year? No    Would the patient like to discuss contraceptive options today? No      Contraception Wrap Up   Current Method Hormonal Implant            The pregnancy intention screening data noted above was reviewed. Potential methods of contraception were discussed. The patient elected to proceed with No data recorded.   Edinburgh Postnatal Depression Scale - 06/13/21 1511       Edinburgh Postnatal Depression Scale:  In the Past 7 Days   I have been able to laugh and see the funny side of things. 0    I have looked forward with enjoyment to things. 0    I have blamed myself unnecessarily when things went wrong. 0    I have been anxious or worried for no good reason. 0    I have felt scared or panicky for no good reason. 0    Things have been getting on top of me. 0    I have been so unhappy that I have had difficulty sleeping. 0    I have felt sad or miserable. 0    I have been so unhappy that I have been crying. 0    The thought of harming myself has occurred to me. 0    Edinburgh Postnatal Depression Scale Total 0             Health Maintenance Due  Topic Date Due   COVID-19 Vaccine  (1) Never done   HPV VACCINES (1 - 2-dose series) Never done   TETANUS/TDAP  Never done    The following portions of the patient's history were reviewed and updated as appropriate: allergies, current medications, past family history, past medical history, past social history, past surgical history, and problem list.  Review of Systems Pertinent items are noted in HPI.  Objective:  BP 127/85   Pulse 68   Wt 235 lb (106.6 kg)   LMP 05/28/2020   Breastfeeding No   BMI 37.93 kg/m    General:  alert, cooperative, and no distress   Breasts:  not indicated  Lungs: Normal effort  Heart:  regular rate and rhythm  Abdomen: soft, non-tender; bowel sounds normal; no masses,  no organomegaly   Wound   GU exam:  not indicated       Assessment:    There are no diagnoses linked to this encounter.  normal postpartum exam.   Plan:   Essential components of care per ACOG recommendations:  1.  Mood and well being: Patient with negative depression screening today. Reviewed local resources for support.  - Patient tobacco use? No.   - hx of drug use? No.  2. Infant care and feeding:  -Patient currently breastmilk feeding? No.  -Social determinants of health (SDOH) reviewed in EPIC. No concerns  3. Sexuality, contraception and birth spacing - Patient does not want a pregnancy in the next year.  Desired family size is 3 children.  - Reviewed reproductive life planning. Reviewed contraceptive methods based on pt preferences and effectiveness.  Patient desired Hormonal Implant today.   - Discussed birth spacing of 18 months  4. Sleep and fatigue -Encouraged family/partner/community support of 4 hrs of uninterrupted sleep to help with mood and fatigue  5. Physical Recovery  - Discussed patients delivery and complications. She describes her labor as good. - Patient had a Vaginal, no problems at delivery. Patient had no laceration. Perineal healing reviewed. Patient expressed  understanding - Patient has urinary incontinence? No. - Patient is safe to resume physical and sexual activity  6.  Health Maintenance - HM due items addressed Yes - Last pap smear  Diagnosis  Date Value Ref Range Status  09/20/2020   Final   - Negative for intraepithelial lesion or malignancy (NILM)   Pap smear not done at today's visit.  -Breast Cancer screening indicated? No.   7. Chronic Disease/Pregnancy Condition follow up: None  - PCP follow up   Center for [employer], [facility]

## 2021-09-12 ENCOUNTER — Ambulatory Visit: Payer: Medicaid Other

## 2021-09-18 ENCOUNTER — Ambulatory Visit: Payer: Self-pay

## 2022-01-24 ENCOUNTER — Ambulatory Visit: Payer: Medicaid Other

## 2022-01-24 ENCOUNTER — Ambulatory Visit
Admission: RE | Admit: 2022-01-24 | Discharge: 2022-01-24 | Disposition: A | Discharge status: Home / Self Care | Payer: Medicaid Other | Source: Ambulatory Visit | Attending: Internal Medicine | Admitting: Internal Medicine

## 2022-01-24 VITALS — BP 108/74 | HR 84 | Temp 98.1°F | Resp 16

## 2022-01-24 DIAGNOSIS — R0989 Other specified symptoms and signs involving the circulatory and respiratory systems: Secondary | ICD-10-CM

## 2022-01-24 DIAGNOSIS — R1030 Lower abdominal pain, unspecified: Secondary | ICD-10-CM | POA: Diagnosis present

## 2022-01-24 DIAGNOSIS — B349 Viral infection, unspecified: Secondary | ICD-10-CM

## 2022-01-24 DIAGNOSIS — Z20822 Contact with and (suspected) exposure to covid-19: Secondary | ICD-10-CM | POA: Diagnosis present

## 2022-01-24 DIAGNOSIS — R197 Diarrhea, unspecified: Secondary | ICD-10-CM

## 2022-01-24 NOTE — ED Provider Notes (Signed)
[name]    CSN: [id number] Arrival date & time: 01/24/22  1234      History   Chief Complaint Chief Complaint  Patient presents with   Abdominal Pain    Stomach pain and Covid test - Entered by patient    HPI Kristi Novak is a 27 y.o. female.   Patient presents with lower abdominal discomfort, diarrhea, runny nose, headache that started this morning.  Patient reports abdominal pain has basically resolved.  Denies any associated nausea, vomiting, blood in stool.  Has not had any medications for symptoms.  Is tolerating fluids well.  Requesting COVID test today to be able to return to work as she has been exposed to COVID-19.  Denies chest pain, shortness of breath, cough.   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   LEFT EYE  AND  1999 BILATERAL EYES 1999   EYE SURGERY     MEDIAN RECTUS REPAIR  08/21/2011   Procedure: MEDIAN RECTUS REPAIR;  Surgeon: Dara Hoyer, MD;  Location: [facility];  Service: Ophthalmology;  Laterality: Right;  inferior oblique myectomy   TYMPANOSTOMY TUBE PLACEMENT  AGE 35   AND ADENOIDECTOMY   WISDOM TOOTH EXTRACTION     ORAL SURGEON OFFICE    OB History     Gravida  3   Para  3   Term  3   Preterm  0   AB  0   Living  3      SAB  0   IAB  0   Ectopic  0   Multiple  0   Live Births  3            Home Medications    Prior to Admission medications   Medication Sig Start Date  End Date Taking? Authorizing Provider  acetaminophen (TYLENOL) 500 MG tablet Take 2 tablets (1,000 mg total) by mouth every 8 (eight) hours as needed (pain). 03/24/21   Genia Del, MD  ferrous sulfate (FERROUSUL) 325 (65 FE) MG tablet Take 1 tablet (325 mg total) by mouth every other day. 01/24/21   Leftwich-Kirby, Kathie Dike, CNM  ibuprofen (ADVIL) 600 MG tablet Take 1 tablet (600 mg total) by mouth every 6 (six) hours as needed (pain). 03/24/21   Genia Del, MD  Prenatal Vit-Fe Phos-FA-Omega (VITAFOL GUMMIES) 3.33-0.333-34.8 MG CHEW Chew 3 tablets by mouth daily. Patient not taking: Reported on 03/20/2021 08/16/20   Griffin Basil, MD    Family History Family History  Problem Relation Age of Onset   Cancer Maternal Grandmother        breast   Hypertension Other    Healthy Mother    Healthy Father     Social History Social History   Tobacco Use   Smoking status: Never   Smokeless tobacco: Never  Vaping Use   Vaping Use:  Never used  Substance Use Topics   Alcohol use: No   Drug use: No     Allergies   Patient has no known allergies.   Review of Systems Review of Systems Per HPI  Physical Exam Triage Vital Signs ED Triage Vitals  Enc Vitals Group     BP 01/24/22 1245 108/74     Pulse Rate 01/24/22 1245 84     Resp 01/24/22 1245 16     Temp 01/24/22 1245 98.1 F (36.7 C)     Temp Source 01/24/22 1245 Oral     SpO2 01/24/22 1245 97 %     Weight --      Height --      Head Circumference --      Peak Flow --      Pain Score 01/24/22 1244 3     Pain Loc --      Pain Edu? --      Excl. in [place]? --    No data found.  Updated Vital Signs BP 108/74 (BP Location: Left Arm)   Pulse 84   Temp 98.1 F (36.7 C) (Oral)   Resp 16   SpO2 97%   Breastfeeding No   Visual Acuity Right Eye Distance:   Left Eye Distance:   Bilateral Distance:    Right Eye Near:   Left Eye Near:    Bilateral Near:     Physical Exam Constitutional:      General: She  is not in acute distress.    Appearance: Normal appearance. She is not toxic-appearing or diaphoretic.  HENT:     Head: Normocephalic and atraumatic.     Right Ear: Tympanic membrane and ear canal normal.     Left Ear: Tympanic membrane and ear canal normal.     Nose: Congestion present.     Mouth/Throat:     Mouth: Mucous membranes are moist.     Pharynx: No posterior oropharyngeal erythema.  Eyes:     Extraocular Movements: Extraocular movements intact.     Conjunctiva/sclera: Conjunctivae normal.     Pupils: Pupils are equal, round, and reactive to light.  Cardiovascular:     Rate and Rhythm: Normal rate and regular rhythm.     Pulses: Normal pulses.     Heart sounds: Normal heart sounds.  Pulmonary:     Effort: Pulmonary effort is normal. No respiratory distress.     Breath sounds: Normal breath sounds. No stridor. No wheezing, rhonchi or rales.  Abdominal:     General: Abdomen is flat. Bowel sounds are normal. There is no distension.     Palpations: Abdomen is soft.     Tenderness: There is no abdominal tenderness.  Musculoskeletal:        General: Normal range of motion.     Cervical back: Normal range of motion.  Skin:    General: Skin is warm and dry.  Neurological:     General: No focal deficit present.     Mental Status: She is alert and oriented to person, place, and time. Mental status is at baseline.  Psychiatric:        Mood and Affect: Mood normal.        Behavior: Behavior normal.      UC Treatments / Results  Labs (all labs ordered are listed, but only abnormal results are displayed) Labs Reviewed  SARS CORONAVIRUS 2 (TAT 6-24 HRS)    EKG   Radiology No results found.  Procedures Procedures (including critical  care time)  Medications Ordered in UC Medications - No data to display  Initial Impression / Assessment and Plan / UC Course  I have reviewed the triage vital signs and the nursing notes.  Pertinent labs & imaging results that were  available during my care of the patient were reviewed by me and considered in my medical decision making (see chart for details).     Patient reports that she is basically presenting just for COVID test today for work.  COVID test is pending.  Patient's symptoms and physical exam appear viral in etiology.  There are no signs of dehydration or acute abdomen on exam so do not think that chest imaging is necessary.  Patient was advised to follow-up if symptoms persist or worsen.  Discussed supportive care and symptom management with patient.  Advised adequate fluid hydration and bland diet.  Patient verbalized understanding and was agreeable with plan. Final Clinical Impressions(s) / UC Diagnoses   Final diagnoses:  Encounter for laboratory testing for COVID-19 virus  Lower abdominal pain  Diarrhea, unspecified type  Runny nose  Viral illness     Discharge Instructions      Covid test is pending.  Will call if it is positive.    ED Prescriptions   None    PDMP not reviewed this encounter.   Gustavus Bryant, [place] 01/24/22 1258

## 2022-01-24 NOTE — Discharge Instructions (Signed)
Covid test is pending.  Will call if it is positive.  

## 2022-01-24 NOTE — ED Triage Notes (Signed)
Patient presents to UC for mid abdominal pain since this morning. Not taking any medications for abdominal pain. States she was exposed to covid yesterday. Employer requiring covid testing.

## 2022-01-25 LAB — SARS CORONAVIRUS 2 (TAT 6-24 HRS): SARS Coronavirus 2: NEGATIVE

## 2022-02-27 ENCOUNTER — Emergency Department (HOSPITAL_COMMUNITY)
Admission: EM | Admit: 2022-02-27 | Discharge: 2022-02-27 | Disposition: A | Discharge status: Home / Self Care | Payer: Medicaid Other | Attending: Emergency Medicine | Admitting: Emergency Medicine

## 2022-02-27 ENCOUNTER — Encounter (HOSPITAL_COMMUNITY): Payer: Self-pay

## 2022-02-27 ENCOUNTER — Other Ambulatory Visit: Payer: Self-pay

## 2022-02-27 DIAGNOSIS — J988 Other specified respiratory disorders: Secondary | ICD-10-CM | POA: Diagnosis not present

## 2022-02-27 DIAGNOSIS — Z20822 Contact with and (suspected) exposure to covid-19: Secondary | ICD-10-CM | POA: Insufficient documentation

## 2022-02-27 DIAGNOSIS — B9789 Other viral agents as the cause of diseases classified elsewhere: Secondary | ICD-10-CM | POA: Diagnosis not present

## 2022-02-27 DIAGNOSIS — R509 Fever, unspecified: Secondary | ICD-10-CM | POA: Diagnosis present

## 2022-02-27 LAB — RESP PANEL BY RT-PCR (RSV, FLU A&B, COVID)  RVPGX2
Influenza A by PCR: NEGATIVE
Influenza B by PCR: NEGATIVE
Resp Syncytial Virus by PCR: NEGATIVE
SARS Coronavirus 2 by RT PCR: NEGATIVE

## 2022-02-27 LAB — GROUP A STREP BY PCR: Group A Strep by PCR: NOT DETECTED

## 2022-02-27 MED ORDER — ACETAMINOPHEN 160 MG/5ML PO SOLN
650.0000 mg | Freq: Once | ORAL | Status: AC
  Administered 2022-02-27: 650 mg via ORAL
  Filled 2022-02-27: qty 20.3

## 2022-02-27 MED ORDER — ACETAMINOPHEN ER 650 MG PO TBCR: 650.0000 mg | EXTENDED_RELEASE_TABLET | Freq: Three times a day (TID) | ORAL | 0 refills | Status: DC | PRN

## 2022-02-27 MED ORDER — ONDANSETRON 4 MG PO TBDP: 4.0000 mg | ORAL_TABLET | Freq: Three times a day (TID) | ORAL | 0 refills | Status: DC | PRN

## 2022-02-27 MED ORDER — GUAIFENESIN 100 MG/5ML PO LIQD: 100.0000 mg | ORAL | 0 refills | Status: DC | PRN

## 2022-02-27 MED ORDER — ACETAMINOPHEN 500 MG PO TABS
1000.0000 mg | ORAL_TABLET | Freq: Once | ORAL | Status: DC
  Filled 2022-02-27: qty 2

## 2022-02-27 MED ORDER — ACETAMINOPHEN 325 MG PO TABS
650.0000 mg | ORAL_TABLET | Freq: Once | ORAL | Status: DC | PRN
  Filled 2022-02-27: qty 2

## 2022-02-27 MED ORDER — ONDANSETRON 4 MG PO TBDP: 4.0000 mg | ORAL_TABLET | Freq: Three times a day (TID) | ORAL | 0 refills | Status: AC | PRN

## 2022-02-27 MED ORDER — GUAIFENESIN 100 MG/5ML PO LIQD: 100.0000 mg | ORAL | 0 refills | Status: AC | PRN

## 2022-02-27 NOTE — Discharge Instructions (Signed)
Please take your medications as prescribed. Take tylenol/ibuprofen for fever/pain. I recommend close follow-up with PCP for reevaluation.  Please do not hesitate to return to emergency department if worrisome signs symptoms we discussed become apparent.

## 2022-02-27 NOTE — ED Triage Notes (Signed)
Cough, sore throat, runny nose, and left ear pain x1 day

## 2022-02-27 NOTE — ED Provider Notes (Signed)
[facility] Provider Note   CSN: [id number] Arrival date & time: 02/27/22  1608     History  Chief Complaint  Patient presents with   Fever   Kristi Novak is a 27 y.o. female presents today for flulike symptoms.  Patient states she has had cough, sore throat, runny nose and left knee pain for 1 day.  Patient states she has been coughing up some mucus and that make her feel nauseated.  No known sick contact.  No chest pain or shortness of breath.   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    Hypertropia RIGHT EYE   Seizures ([place]) 2007   Idiopathic   had two seizures (none since)   Past Surgical History:  Procedure Laterality Date   EYE SURGERY  05-01-2005  [facility]   LEFT EYE  AND  1999 BILATERAL EYES 1999   EYE SURGERY     MEDIAN RECTUS REPAIR  08/21/2011   Procedure: MEDIAN RECTUS REPAIR;  Surgeon: Dara Hoyer, MD;  Location: [facility];  Service: Ophthalmology;  Laterality: Right;  inferior oblique myectomy   TYMPANOSTOMY TUBE PLACEMENT  AGE 48   AND ADENOIDECTOMY   WISDOM TOOTH EXTRACTION     ORAL SURGEON OFFICE     Home Medications Prior to Admission medications   Medication Sig Start Date End Date Taking? Authorizing Provider  acetaminophen (TYLENOL 8 HOUR) 650 MG CR tablet Take 1 tablet (650 mg total) by mouth every 8 (eight) hours as needed for up to 15 doses for pain. 02/27/22  Yes Rex Kras, PA  guaiFENesin (ROBITUSSIN) 100 MG/5ML liquid Take 5-10 mLs (100-200 mg total) by mouth every 4 (four) hours as needed for cough or to loosen phlegm. 02/27/22  Yes Rex Kras, PA  ondansetron (ZOFRAN-ODT) 4 MG disintegrating tablet Take 1 tablet (4 mg total) by mouth every 8 (eight) hours as needed for nausea or vomiting. 02/27/22  Yes Rex Kras, PA   acetaminophen (TYLENOL) 500 MG tablet Take 2 tablets (1,000 mg total) by mouth every 8 (eight) hours as needed (pain). 03/24/21   Genia Del, MD  ferrous sulfate (FERROUSUL) 325 (65 FE) MG tablet Take 1 tablet (325 mg total) by mouth every other day. 01/24/21   Leftwich-Kirby, Kathie Dike, CNM  ibuprofen (ADVIL) 600 MG tablet Take 1 tablet (600 mg total) by mouth every 6 (six) hours as needed (pain). 03/24/21   Genia Del, MD  Prenatal Vit-Fe Phos-FA-Omega (VITAFOL GUMMIES) 3.33-0.333-34.8 MG CHEW Chew 3 tablets by mouth daily. Patient not taking: Reported on 03/20/2021 08/16/20   Griffin Basil, MD      Allergies    Patient has no known allergies.    Review of Systems   Review of Systems  Constitutional:  Positive for fever.    Physical Exam Updated Vital Signs BP 107/73   Pulse (!) 118   Temp 99.8 F (37.7 C) (Oral)   Resp 18   Wt 106 kg   SpO2 98%   BMI 37.72 kg/m  Physical Exam Vitals and nursing note reviewed.  Constitutional:      Appearance: Normal appearance.  HENT:     Head: Normocephalic and atraumatic.     Mouth/Throat:     Mouth:  Mucous membranes are moist.  Eyes:     General: No scleral icterus. Cardiovascular:     Rate and Rhythm: Normal rate and regular rhythm.     Pulses: Normal pulses.     Heart sounds: Normal heart sounds.  Pulmonary:     Effort: Pulmonary effort is normal.     Breath sounds: Normal breath sounds.  Abdominal:     General: Abdomen is flat.     Palpations: Abdomen is soft.     Tenderness: There is no abdominal tenderness.  Musculoskeletal:        General: No deformity.  Skin:    General: Skin is warm.     Findings: No rash.  Neurological:     General: No focal deficit present.     Mental Status: She is alert.  Psychiatric:        Mood and Affect: Mood normal.     ED Results / Procedures / Treatments   Labs (all labs ordered are listed, but only abnormal results are displayed) Labs Reviewed  RESP PANEL BY  RT-PCR (RSV, FLU A&B, COVID)  RVPGX2  GROUP A STREP BY PCR    EKG None  Radiology No results found.  Procedures Procedures    Medications Ordered in ED Medications  acetaminophen (TYLENOL) 160 MG/5ML solution 650 mg (650 mg Oral Given 02/27/22 1647)    ED Course/ Medical Decision Making/ A&P                             Medical Decision Making Risk OTC drugs.   This patient presents to the ED for sore throat, body aches, this involves an extensive number of treatment options, and is a complaint that carries with a high risk of complications and morbidity.  The differential diagnosis includes flu, COVID, RSV, strep, pharyngitis, bronchitis, pneumonia, infectious etiology.  This is not an exhaustive list.  Lab tests: I ordered and personally interpreted labs.  The pertinent results include: Viral panel negative.  Strep negative.  Imaging studies:  Problem list/ ED course/ Critical interventions/ Medical management: HPI: See above Vital signs within normal range and stable throughout visit. Laboratory/imaging studies significant for: See above. On physical examination, patient is afebrile and appears in no acute distress. This patient presents with symptoms suspicious for viral upper respiratory infection. Based on history and physical doubt sinusitis. COVID test was negative. Do not suspect underlying cardiopulmonary process. I considered, but think unlikely, pneumonia. Patient is nontoxic appearing and not in need of emergent medical intervention. Patient told to self isolate at home until symptoms subside for 72 hours. Recommended patient to take TheraFlu or Mucinex for symptom relief.  Follow-up with primary care physician for further evaluation and management.  Return to the ER if new or worsening symptoms. I have reviewed the patient home medicines and have made adjustments as needed.  Cardiac monitoring/EKG: The patient was maintained on a cardiac monitor.  I personally  reviewed and interpreted the cardiac monitor which showed an underlying rhythm of: sinus rhythm.  Additional history obtained: External records from outside source obtained and reviewed including: Chart review including previous notes, labs, imaging.  Consultations obtained:  Disposition Continued outpatient therapy. Follow-up with PCP recommended for reevaluation of symptoms. Treatment plan discussed with patient.  Pt acknowledged understanding was agreeable to the plan. Worrisome signs and symptoms were discussed with patient, and patient acknowledged understanding to return to the ED if they noticed these signs and symptoms. Patient  was stable upon discharge.   This chart was dictated using voice recognition software.  Despite best efforts to proofread,  errors can occur which can change the documentation meaning.          Final Clinical Impression(s) / ED Diagnoses Final diagnoses:  Viral respiratory illness    Rx / DC Orders ED Discharge Orders          Ordered    guaiFENesin (ROBITUSSIN) 100 MG/5ML liquid  Every 4 hours PRN        02/27/22 2038    ondansetron (ZOFRAN-ODT) 4 MG disintegrating tablet  Every 8 hours PRN        02/27/22 2038    acetaminophen (TYLENOL 8 HOUR) 650 MG CR tablet  Every 8 hours PRN        02/27/22 2038              Rex Kras, PA 02/28/22 0020    Drenda Freeze, MD 03/04/22 [phone]

## 2022-08-12 ENCOUNTER — Ambulatory Visit
Admission: EM | Admit: 2022-08-12 | Discharge: 2022-08-12 | Disposition: A | Discharge status: Home / Self Care | Payer: Medicaid Other | Attending: Family Medicine | Admitting: Family Medicine

## 2022-08-12 DIAGNOSIS — Z20822 Contact with and (suspected) exposure to covid-19: Secondary | ICD-10-CM

## 2022-08-12 MED ORDER — CYCLOBENZAPRINE HCL 10 MG PO TABS: 10.0000 mg | ORAL_TABLET | Freq: Every day | ORAL | 0 refills | Status: AC

## 2022-08-12 MED ORDER — ACETAMINOPHEN 500 MG PO TABS
1000.0000 mg | ORAL_TABLET | Freq: Three times a day (TID) | ORAL | 0 refills | Status: AC | PRN
Start: 2022-08-12 — End: ?

## 2022-08-12 NOTE — Discharge Instructions (Signed)
COVID test will result within 24 hours our office will contact you directly if your COVID test is positive.  You currently have a fever continue to take Tylenol.  I have provided you with a school and work note.  You may return back on 08/14/2022 as long as you are fever free.

## 2022-08-12 NOTE — ED Provider Notes (Signed)
[facility]    CSN: [id number] Arrival date & time: 08/12/22  1655      History   Chief Complaint No chief complaint on file.   HPI Kristi Novak is a 27 y.o. female.   HPI Patient presents today with 2 days of headache and fever and generally feeling unwell.  She is here for COVID testing.  She reports taking ibuprofen for management of headache without improvement of headache pain.  He is unaware of any known sick contacts.  At present she is not having any other URI symptoms.  Reports that she has never had COVID before.  Denies any shortness of breath, chest pain, nausea or vomiting.  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    Hypertropia RIGHT EYE   Seizures ([facility]) 2007   Idiopathic   had two seizures (none since)    Patient Active Problem List   Diagnosis Date Noted   Nexplanon in place 03/24/2021   History of seizures 09/20/2020   Hypertropia 08/20/2011    Past Surgical History:  Procedure Laterality Date   EYE SURGERY  05-01-2005  [facility]   LEFT EYE  AND  1999 BILATERAL EYES 1999   EYE SURGERY     MEDIAN RECTUS REPAIR  08/21/2011   Procedure: MEDIAN RECTUS REPAIR;  Surgeon: Corinda Gubler, MD;  Location: [facility];  Service: Ophthalmology;  Laterality: Right;  inferior oblique myectomy   TYMPANOSTOMY TUBE PLACEMENT  AGE 47   AND ADENOIDECTOMY   WISDOM TOOTH EXTRACTION     ORAL SURGEON OFFICE    OB History     Gravida  3   Para  3   Term  3   Preterm  0   AB  0   Living  3      SAB  0   IAB  0   Ectopic  0   Multiple  0   Live Births  3            Home Medications    Prior to Admission medications   Medication Sig Start Date End Date Taking? Authorizing Provider  cyclobenzaprine (FLEXERIL) 10 MG tablet Take 1 tablet (10 mg total) by mouth at  bedtime. 08/12/22  Yes Bing Neighbors, NP  acetaminophen (TYLENOL) 500 MG tablet Take 2 tablets (1,000 mg total) by mouth every 8 (eight) hours as needed (pain). 08/12/22   Bing Neighbors, NP  ferrous sulfate (FERROUSUL) 325 (65 FE) MG tablet Take 1 tablet (325 mg total) by mouth every other day. 01/24/21   Leftwich-Kirby, Wilmer Floor, CNM  guaiFENesin (ROBITUSSIN) 100 MG/5ML liquid Take 5-10 mLs (100-200 mg total) by mouth every 4 (four) hours as needed for cough or to loosen phlegm. 02/27/22   Jeanelle Malling, PA  ibuprofen (ADVIL) 600 MG tablet Take 1 tablet (600 mg total) by mouth every 6 (six) hours as needed (pain). 03/24/21   Worthy Rancher, MD  ondansetron (ZOFRAN-ODT) 4 MG disintegrating tablet Take 1 tablet (4 mg total) by mouth every 8 (eight) hours as needed for nausea or vomiting. 02/27/22   Jeanelle Malling, PA  Prenatal Vit-Fe Phos-FA-Omega (VITAFOL GUMMIES) 3.33-0.333-34.8 MG CHEW Chew 3 tablets by mouth daily. Patient not taking: Reported on 03/20/2021 08/16/20   Warden Fillers, MD  Family History Family History  Problem Relation Age of Onset   Cancer Maternal Grandmother        breast   Hypertension Other    Healthy Mother    Healthy Father     Social History Social History   Tobacco Use   Smoking status: Never   Smokeless tobacco: Never  Vaping Use   Vaping status: Never Used  Substance Use Topics   Alcohol use: No   Drug use: No     Allergies   Patient has no known allergies.   Review of Systems Review of Systems Pertinent negatives listed in HPI   Physical Exam Triage Vital Signs ED Triage Vitals  Encounter Vitals Group     BP 08/12/22 1716 117/83     Systolic BP Percentile --      Diastolic BP Percentile --      Pulse Rate 08/12/22 1716 94     Resp 08/12/22 1716 16     Temp 08/12/22 1716 100.3 F (37.9 C)     Temp Source 08/12/22 1716 Oral     SpO2 08/12/22 1716 98 %     Weight --      Height --      Head Circumference --      Peak Flow --      Pain  Score 08/12/22 1715 10     Pain Loc --      Pain Education --      Exclude from Growth Chart --    No data found.  Updated Vital Signs BP 117/83 (BP Location: Right Arm)   Pulse 94   Temp 100.3 F (37.9 C) (Oral)   Resp 16   LMP 07/30/2022 (Approximate)   SpO2 98%   Visual Acuity Right Eye Distance:   Left Eye Distance:   Bilateral Distance:    Right Eye Near:   Left Eye Near:    Bilateral Near:     Physical Exam Vitals reviewed.  Constitutional:      Appearance: Normal appearance. She is ill-appearing.  HENT:     Head: Normocephalic and atraumatic.  Eyes:     Extraocular Movements: Extraocular movements intact.     Pupils: Pupils are equal, round, and reactive to light.  Cardiovascular:     Rate and Rhythm: Normal rate and regular rhythm.  Pulmonary:     Effort: Pulmonary effort is normal.     Breath sounds: Normal breath sounds.  Skin:    General: Skin is warm.  Neurological:     General: No focal deficit present.     Mental Status: She is alert.      UC Treatments / Results  Labs (all labs ordered are listed, but only abnormal results are displayed) Labs Reviewed  SARS CORONAVIRUS 2 (TAT 6-24 HRS)    EKG   Radiology No results found.  Procedures Procedures (including critical care time)  Medications Ordered in UC Medications - No data to display  Initial Impression / Assessment and Plan / UC Course  I have reviewed the triage vital signs and the nursing notes.  Pertinent labs & imaging results that were available during my care of the patient were reviewed by me and considered in my medical decision making (see chart for details).    COVID-19 test pending.  Cyclobenzaprine 10 mg nightly for encouraged to force fluids.  Work note provided to return back to work and school in 2 days as if fever free.  Return as needed. Final Clinical  Impressions(s) / UC Diagnoses   Final diagnoses:  Encounter for laboratory testing for COVID-19 virus   Suspected COVID-19 virus infection     Discharge Instructions      COVID test will result within 24 hours our office will contact you directly if your COVID test is positive.  You currently have a fever continue to take Tylenol.  I have provided you with a school and work note.  You may return back on 08/14/2022 as long as you are fever free.     ED Prescriptions     Medication Sig Dispense Auth. Provider   cyclobenzaprine (FLEXERIL) 10 MG tablet Take 1 tablet (10 mg total) by mouth at bedtime. 20 tablet Bing Neighbors, NP   acetaminophen (TYLENOL) 500 MG tablet Take 2 tablets (1,000 mg total) by mouth every 8 (eight) hours as needed (pain). 60 tablet Bing Neighbors, NP      PDMP not reviewed this encounter.   Bing Neighbors, NP 08/12/22 [phone]

## 2022-08-12 NOTE — ED Triage Notes (Signed)
Pt presents to UC w/ c/o headache since yesterday. Took ibuprofen without relief. Pain radiating to eyes and teeth.

## 2022-08-13 ENCOUNTER — Encounter (HOSPITAL_COMMUNITY): Payer: Self-pay

## 2022-08-13 ENCOUNTER — Emergency Department (HOSPITAL_COMMUNITY)
Admission: EM | Admit: 2022-08-13 | Discharge: 2022-08-13 | Disposition: A | Discharge status: Home / Self Care | Payer: Medicaid Other | Attending: Emergency Medicine | Admitting: Emergency Medicine

## 2022-08-13 ENCOUNTER — Other Ambulatory Visit: Payer: Self-pay

## 2022-08-13 DIAGNOSIS — J019 Acute sinusitis, unspecified: Secondary | ICD-10-CM | POA: Insufficient documentation

## 2022-08-13 DIAGNOSIS — R519 Headache, unspecified: Secondary | ICD-10-CM | POA: Diagnosis not present

## 2022-08-13 LAB — COMPREHENSIVE METABOLIC PANEL
ALT: 19 U/L (ref 0–44)
AST: 16 U/L (ref 15–41)
Albumin: 4.1 g/dL (ref 3.5–5.0)
Alkaline Phosphatase: 46 U/L (ref 38–126)
Anion gap: 7 (ref 5–15)
BUN: 12 mg/dL (ref 6–20)
CO2: 25 mmol/L (ref 22–32)
Calcium: 9 mg/dL (ref 8.9–10.3)
Chloride: 104 mmol/L (ref 98–111)
Creatinine, Ser: 0.99 mg/dL (ref 0.44–1.00)
GFR, Estimated: >60 mL/min (ref >60)
Glucose, Bld: 99 mg/dL (ref 70–99)
Potassium: 3.3 mmol/L — ABNORMAL LOW (ref 3.5–5.1)
Sodium: 136 mmol/L (ref 135–145)
Total Bilirubin: 0.5 mg/dL (ref 0.3–1.2)
Total Protein: 8.2 g/dL — ABNORMAL HIGH (ref 6.5–8.1)

## 2022-08-13 LAB — CBC
HCT: 42.3 % (ref 36.0–46.0)
Hemoglobin: 13.2 g/dL (ref 12.0–15.0)
MCH: 27.3 pg (ref 26.0–34.0)
MCHC: 31.2 g/dL (ref 30.0–36.0)
MCV: 87.4 fL (ref 80.0–100.0)
Platelets: 253 10*3/uL (ref 150–400)
RBC: 4.84 MIL/uL (ref 3.87–5.11)
RDW: 13.3 % (ref 11.5–15.5)
WBC: 5.6 10*3/uL (ref 4.0–10.5)
nRBC: 0 % (ref 0.0–0.2)

## 2022-08-13 MED ORDER — NAPROXEN 375 MG PO TABS: 375.0000 mg | ORAL_TABLET | Freq: Two times a day (BID) | ORAL | 0 refills | Status: DC

## 2022-08-13 MED ORDER — AMOXICILLIN 500 MG PO CAPS: 500.0000 mg | ORAL_CAPSULE | Freq: Three times a day (TID) | ORAL | 0 refills | Status: DC

## 2022-08-13 MED ORDER — KETOROLAC TROMETHAMINE 30 MG/ML IJ SOLN
30.0000 mg | Freq: Once | INTRAMUSCULAR | Status: AC
Start: 2022-08-13 — End: 2022-08-13
  Administered 2022-08-13: 30 mg via INTRAVENOUS
  Filled 2022-08-13: qty 1

## 2022-08-13 MED ORDER — PROCHLORPERAZINE EDISYLATE 10 MG/2ML IJ SOLN
10.0000 mg | Freq: Once | INTRAMUSCULAR | Status: AC
  Administered 2022-08-13: 10 mg via INTRAVENOUS
  Filled 2022-08-13: qty 2

## 2022-08-13 MED ORDER — LACTATED RINGERS IV BOLUS
1000.0000 mL | Freq: Once | INTRAVENOUS | Status: AC
  Administered 2022-08-13: 1000 mL via INTRAVENOUS

## 2022-08-13 MED ORDER — NAPROXEN 375 MG PO TABS: 375.0000 mg | ORAL_TABLET | Freq: Two times a day (BID) | ORAL | 0 refills | Status: AC

## 2022-08-13 MED ORDER — AMOXICILLIN 500 MG PO CAPS: 500.0000 mg | ORAL_CAPSULE | Freq: Three times a day (TID) | ORAL | 0 refills | Status: AC

## 2022-08-13 NOTE — ED Notes (Addendum)
Unable to collect labs. Hard stick.

## 2022-08-13 NOTE — ED Triage Notes (Signed)
Patient reports headache, fever, and nausea x 3  days. Patient seen at [facility] and was COVID negative, but pain is worsening.

## 2022-08-13 NOTE — ED Provider Notes (Signed)
[facility] AT [facility] Provider Note   CSN: [id number] Arrival date & time: 08/13/22  1314     History  Chief Complaint  Patient presents with   Headache    Kristi Novak is a 27 y.o. female.   Headache  Patient has a history of seizures but none since 2007.  Prior UTIs.,  Prior eye surgery.  Patient presents ED with complaints of headache, facial pain.  Patient states her symptoms have been ongoing for a few days.  She has had low-grade fevers.  Patient denies any trouble with earache.  No sore throat.  No coughing.  Patient went to an urgent care yesterday.  She had COVID testing which was negative.  Patient states she still having the symptoms so she came to the ED for evaluation  Home Medications Prior to Admission medications   Medication Sig Start Date End Date Taking? Authorizing Provider  amoxicillin (AMOXIL) 500 MG capsule Take 1 capsule (500 mg total) by mouth 3 (three) times daily. 08/13/22  Yes Linwood Dibbles, MD  naproxen (NAPROSYN) 375 MG tablet Take 1 tablet (375 mg total) by mouth 2 (two) times daily. 08/13/22  Yes Linwood Dibbles, MD  acetaminophen (TYLENOL) 500 MG tablet Take 2 tablets (1,000 mg total) by mouth every 8 (eight) hours as needed (pain). 08/12/22   Bing Neighbors, NP  cyclobenzaprine (FLEXERIL) 10 MG tablet Take 1 tablet (10 mg total) by mouth at bedtime. 08/12/22   Bing Neighbors, NP  ferrous sulfate (FERROUSUL) 325 (65 FE) MG tablet Take 1 tablet (325 mg total) by mouth every other day. 01/24/21   Leftwich-Kirby, Wilmer Floor, CNM  guaiFENesin (ROBITUSSIN) 100 MG/5ML liquid Take 5-10 mLs (100-200 mg total) by mouth every 4 (four) hours as needed for cough or to loosen phlegm. 02/27/22   Jeanelle Malling, PA  ibuprofen (ADVIL) 600 MG tablet Take 1 tablet (600 mg total) by mouth every 6 (six) hours as needed (pain). 03/24/21   Worthy Rancher, MD  ondansetron (ZOFRAN-ODT) 4 MG disintegrating tablet Take 1 tablet (4 mg total) by mouth every  8 (eight) hours as needed for nausea or vomiting. 02/27/22   Jeanelle Malling, PA  Prenatal Vit-Fe Phos-FA-Omega (VITAFOL GUMMIES) 3.33-0.333-34.8 MG CHEW Chew 3 tablets by mouth daily. Patient not taking: Reported on 03/20/2021 08/16/20   Warden Fillers, MD      Allergies    Patient has no known allergies.    Review of Systems   Review of Systems  Neurological:  Positive for headaches.    Physical Exam Updated Vital Signs BP (!) 146/90   Pulse 61   Temp 99.6 F (37.6 C)   Resp 16   Ht 1.676 m (5\' 6" )   Wt 104.3 kg   LMP 07/30/2022 (Approximate)   SpO2 100%   BMI 37.12 kg/m  Physical Exam Vitals and nursing note reviewed.  Constitutional:      General: She is not in acute distress.    Appearance: She is well-developed.  HENT:     Head: Normocephalic and atraumatic.     Right Ear: External ear normal.     Left Ear: External ear normal.     Mouth/Throat:     Mouth: Mucous membranes are moist.     Pharynx: Oropharynx is clear.     Comments: No oropharyngeal swelling, no erythema or exudate Eyes:     General: No scleral icterus.       Right eye: No discharge.  Left eye: No discharge.     Conjunctiva/sclera: Conjunctivae normal.  Neck:     Trachea: No tracheal deviation.  Cardiovascular:     Rate and Rhythm: Normal rate and regular rhythm.  Pulmonary:     Effort: Pulmonary effort is normal. No respiratory distress.     Breath sounds: Normal breath sounds. No stridor. No wheezing or rales.  Abdominal:     General: Bowel sounds are normal. There is no distension.     Palpations: Abdomen is soft.     Tenderness: There is no abdominal tenderness. There is no guarding or rebound.  Musculoskeletal:        General: No tenderness or deformity.     Cervical back: Neck supple. No rigidity.  Lymphadenopathy:     Cervical: No cervical adenopathy.  Skin:    General: Skin is warm and dry.     Findings: No rash.  Neurological:     General: No focal deficit present.     Mental  Status: She is alert.     Cranial Nerves: No cranial nerve deficit, dysarthria or facial asymmetry.     Sensory: No sensory deficit.     Motor: No abnormal muscle tone or seizure activity.     Coordination: Coordination normal.  Psychiatric:        Mood and Affect: Mood normal.     ED Results / Procedures / Treatments   Labs (all labs ordered are listed, but only abnormal results are displayed) Labs Reviewed  COMPREHENSIVE METABOLIC PANEL - Abnormal; Notable for the following components:      Result Value   Potassium 3.3 (*)    Total Protein 8.2 (*)    All other components within normal limits  CBC    EKG None  Radiology No results found.  Procedures Procedures    Medications Ordered in ED Medications  lactated ringers bolus 1,000 mL (1,000 mLs Intravenous New Bag/Given 08/13/22 1700)  ketorolac (TORADOL) 30 MG/ML injection 30 mg (30 mg Intravenous Given 08/13/22 1700)  prochlorperazine (COMPAZINE) injection 10 mg (10 mg Intravenous Given 08/13/22 1659)    ED Course/ Medical Decision Making/ A&P Clinical Course as of 08/13/22 1819  Tue Aug 13, 2022  1743 CBC metabolic panel normal [JK]  1817 Patient states her headache has improved with medications [JK]    Clinical Course User Index [JK] Linwood Dibbles, MD                                 Medical Decision Making Parental diagnosis includes but not limited to viral infection, meningitis, sinusitis  Problems Addressed: Acute sinusitis, recurrence not specified, unspecified location: acute illness or injury that poses a threat to life or bodily functions Headache disorder: acute illness or injury  Amount and/or Complexity of Data Reviewed Labs: ordered. Decision-making details documented in ED Course.  Risk Prescription drug management.   Patient presented to the ED for evaluation of persistent headache.  Patient has had some mild URI symptoms.  On exam patient does have some mild sinus tenderness.  She has no fever  here.  Patient has a normal neurologic exam.  No meningismus.  Her neck is supple.  I doubt meningitis encephalitis.   Patient recently tested COVID-negative.  It is possible this could be some type of viral illness.  Viral sinusitis is also a possibility with her sinus tenderness headache.  Will discharge home with course of medications.  Discussed outpatient  follow-up.  Evaluation and diagnostic testing in the emergency department does not suggest an emergent condition requiring admission or immediate intervention beyond what has been performed at this time.  The patient is safe for discharge and has been instructed to return immediately for worsening symptoms, change in symptoms or any other concerns.        Final Clinical Impression(s) / ED Diagnoses Final diagnoses:  Headache disorder  Acute sinusitis, recurrence not specified, unspecified location    Rx / DC Orders ED Discharge Orders          Ordered    amoxicillin (AMOXIL) 500 MG capsule  3 times daily        08/13/22 1816    naproxen (NAPROSYN) 375 MG tablet  2 times daily        08/13/22 1816              Linwood Dibbles, MD 08/13/22 1819

## 2022-08-13 NOTE — Discharge Instructions (Signed)
Prescribed to help with your headache.  The symptoms should be improving by the end of the week.  Doctor to be rechecked.  Return as needed for worsening symptoms

## 2023-09-06 IMAGING — US US MFM UA CORD DOPPLER
1 series · 14 of 28 positions shown · non-contrast
Comparison: none

[Series 1: us mfm ua cord doppler · 40 acquisitions, 14 frames shown]
[im 2/40]
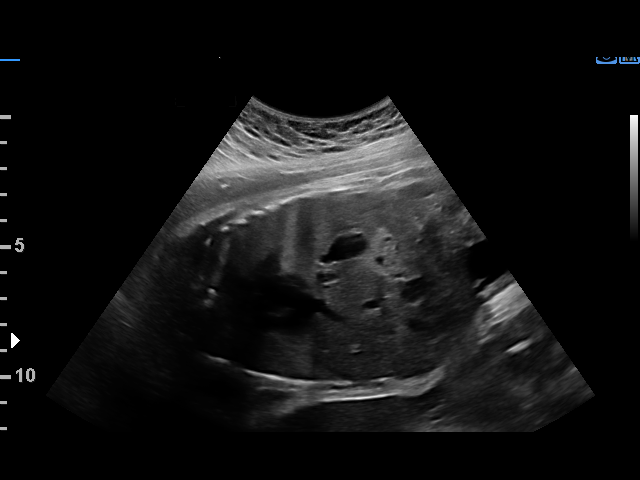
[im 5/40]
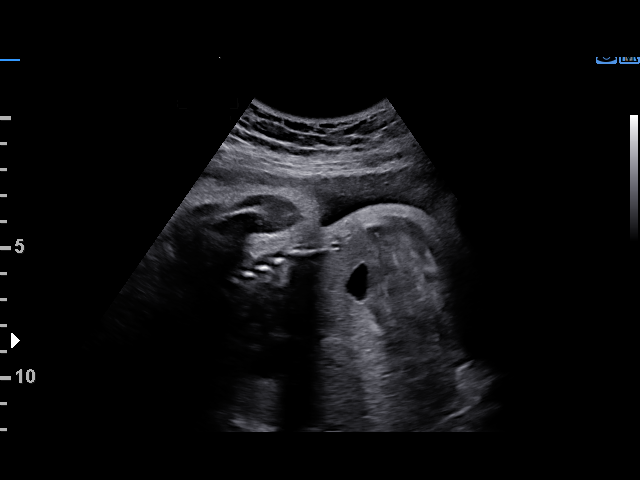
[im 8/40]
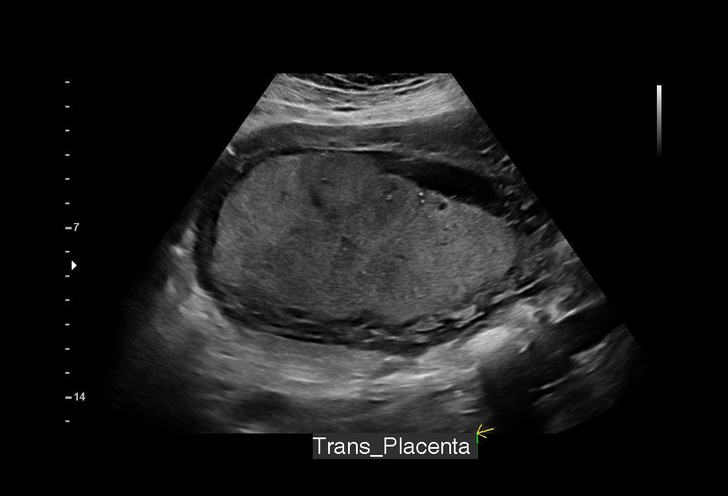
[im 11/40]
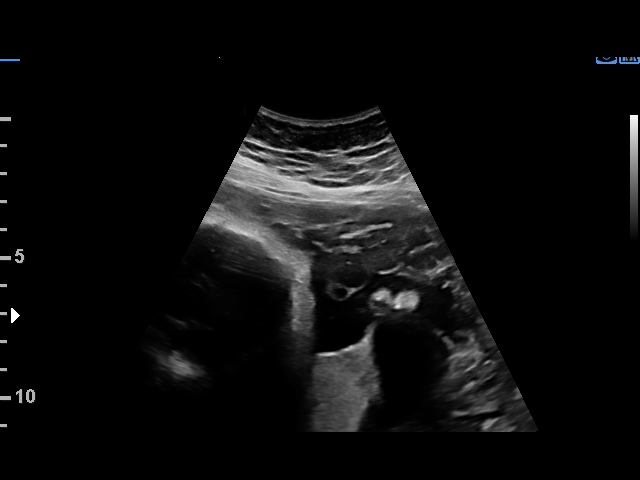
[im 14/40]
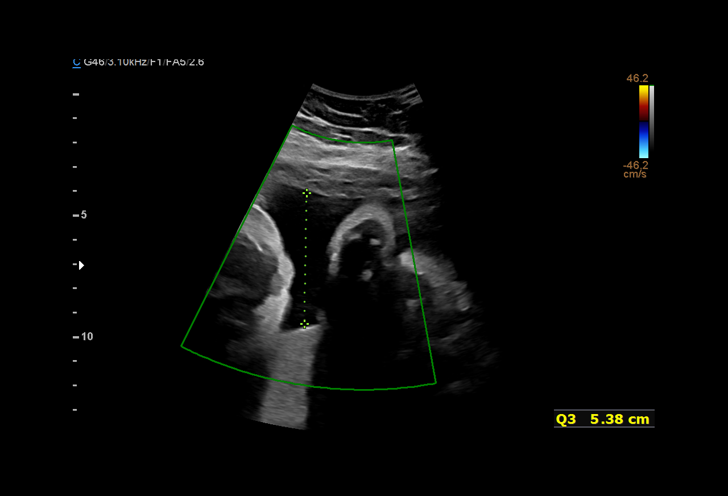
[im 16/40]
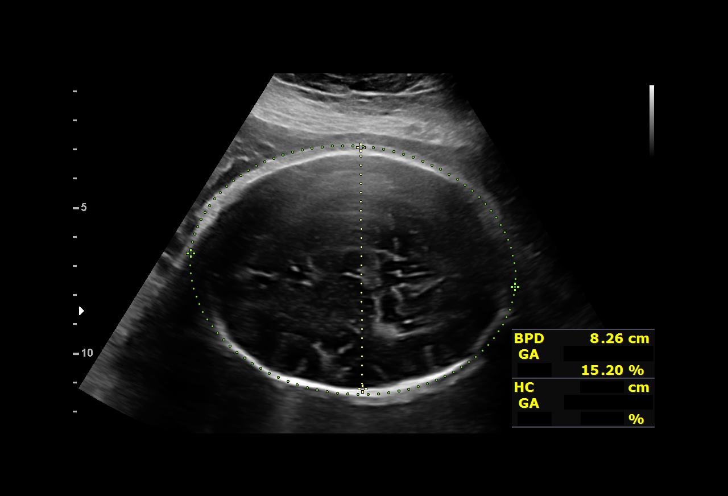
[im 19/40]
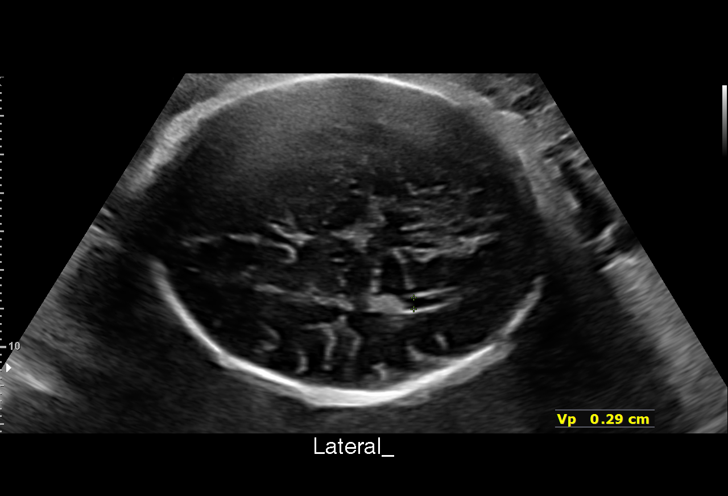
[im 22/40]
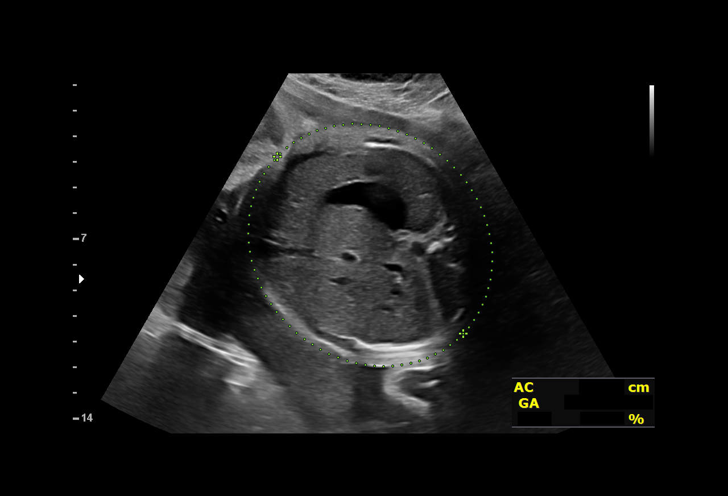
[im 25/40]
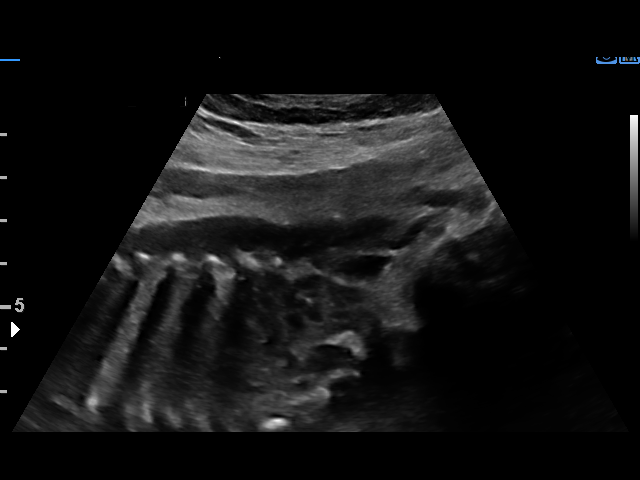
[im 28/40]
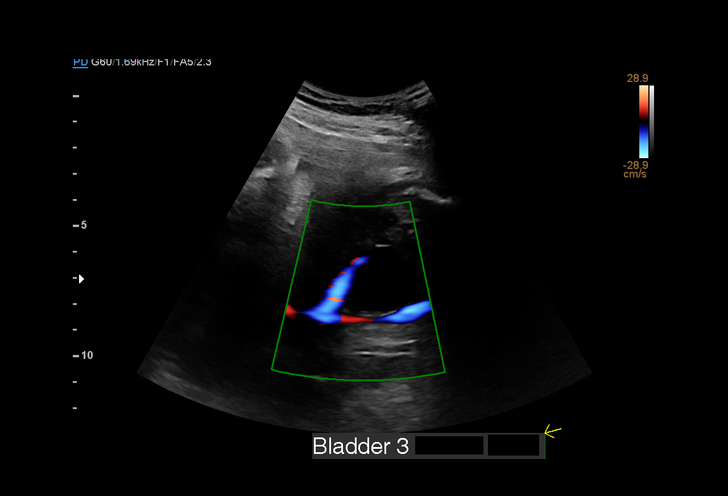
[im 31/40]
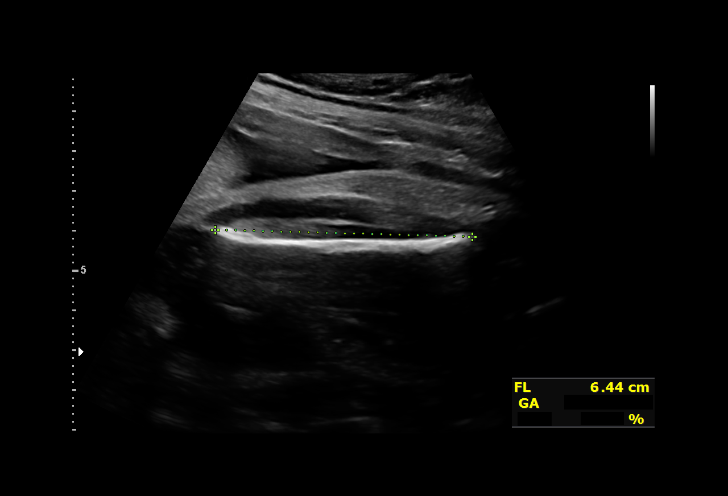
[im 34/40]
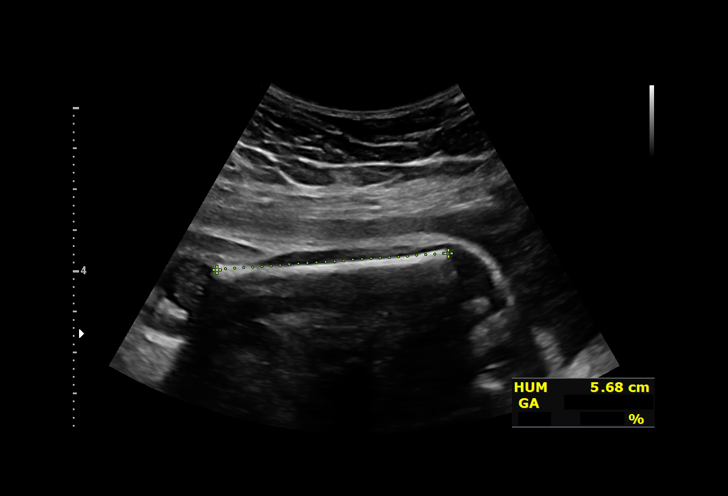
[im 37/40]
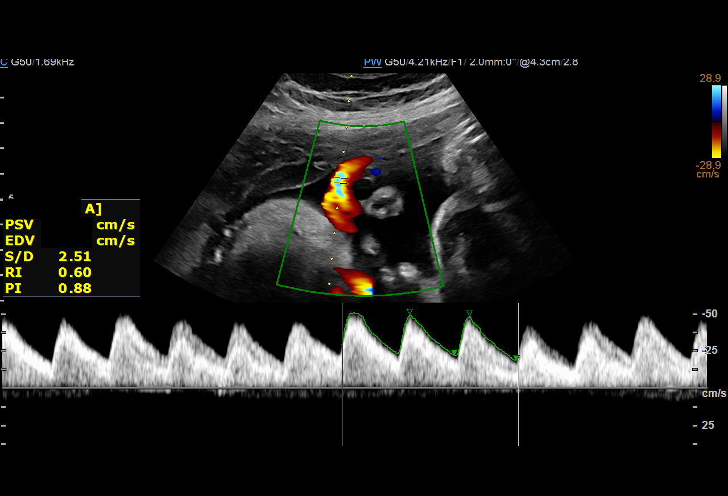
[im 40/40]
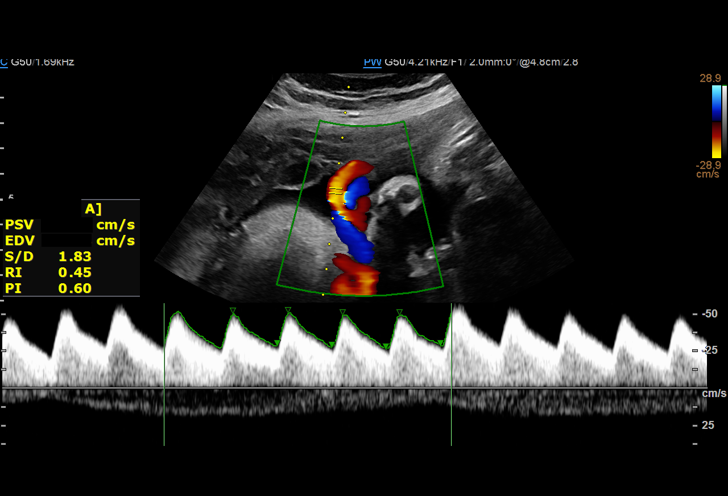

[14 of 28 positions shown; findings below may reference images not displayed]

[REDACTED]care

Indications

 Obesity complicating pregnancy, third
 trimester (BMI 37)
 Small for gestational age fetus affecting
 management of mother
 LR NIPS
 34 weeks gestation of pregnancy
Fetal Evaluation

 Num Of Fetuses:         1
 Fetal Heart Rate(bpm):  140
 Cardiac Activity:       Observed
 Presentation:           Breech
 Placenta:               Posterior
 P. Cord Insertion:      Previously Visualized

 Amniotic Fluid
 AFI FV:      Within normal limits

 AFI Sum(cm)     %Tile       Largest Pocket(cm)
 13.31           44

 RUQ(cm)       RLQ(cm)       LUQ(cm)        LLQ(cm)
 0
Biophysical Evaluation

 Amniotic F.V:   Pocket => 2 cm             F. Tone:        Observed
 F. Movement:    Observed                   Score:          [DATE]
 F. Breathing:   Observed
Biometry

 LV:        2.9  mm
Gestational Age

 Best:          34w 4d     Det. By:  U/S C R L  (08/16/20)    EDD:   04/06/21
Anatomy

 Cranium:               Appears normal         LVOT:                   Previously seen
 Cavum:                 Appears normal         Aortic Arch:            Previously seen
 Ventricles:            Appears normal         Ductal Arch:            Previously seen
 Choroid Plexus:        Previously seen        Diaphragm:              Previously seen
 Cerebellum:            Previously seen        Stomach:                Appears normal, left
                                                                       sided
 Posterior Fossa:       Previously seen        Abdomen:                Previously seen
 Nuchal Fold:           Previously seen        Abdominal Wall:         Previously seen
 Face:                  Orbits and profile     Cord Vessels:           Appears normal (3
                        previously seen                                vessel cord)
 Lips:                  Previously seen        Kidneys:                Appear normal
 Palate:                Previously seen        Bladder:                Appears normal
 Thoracic:              Previously seen        Spine:                  Previously seen
 Heart:                 Previously seen        Upper Extremities:      Previously seen
 RVOT:                  Previously seen        Lower Extremities:      Previously seen

 Other:  Female gender previously seen. Nasal bone, lenses, heels/feet, open
         hands/5th digits, VC, 3VV and 3VTV previously visualized.
         Technically difficult due to fetal position.
Doppler - Fetal Vessels

 Umbilical Artery
  S/D     %tile      RI    %tile      PI    %tile            ADFV    RDFV
   2.2       30    0.55       33    0.77       32               No      No

Cervix Uterus Adnexa

 Cervix
 Not visualized (advanced GA >04wks)

 Uterus
 Normal shape and size.

 Right Ovary
 Within normal limits.

 Left Ovary
 Not visualized.
 Cul De Sac
 No free fluid seen.

 Adnexa
 No adnexal mass visualized.
Comments

 This patient was seen due to maternal obesity and a
 borderline IUGR fetus.  She denies any problems since her
 last exam.  She reports feeling vigorous fetal movements
 throughout the day.
 A biophysical profile performed today was [DATE].
 There was normal amniotic fluid noted on today's ultrasound
 exam.
 Doppler studies of the umbilical arteries showed a normal
 S/D ratio of 2.2.  There were no signs of absent or reversed
 end-diastolic flow noted today.
 She will return in 1 week for another growth scan and BPP.
